# Patient Record
Sex: Female | Born: 1970 | Race: White | Hispanic: No | Marital: Married | Smoking: Former smoker
Health system: Southern US, Community
[De-identification: ages and names within clinical notes are randomized; demographics above are authoritative.]

## PROBLEM LIST (undated history)

## (undated) DIAGNOSIS — L709 Acne, unspecified: Secondary | ICD-10-CM

## (undated) DIAGNOSIS — L409 Psoriasis, unspecified: Secondary | ICD-10-CM

## (undated) DIAGNOSIS — N809 Endometriosis, unspecified: Secondary | ICD-10-CM

## (undated) HISTORY — DX: Psoriasis, unspecified: L40.9

## (undated) HISTORY — DX: Endometriosis, unspecified: N80.9

## (undated) HISTORY — DX: Acne, unspecified: L70.9

---

## 1999-09-06 ENCOUNTER — Other Ambulatory Visit: Admission: RE | Admit: 1999-09-06 | Discharge: 1999-09-06 | Payer: Self-pay | Admitting: Gynecology

## 2000-08-08 ENCOUNTER — Other Ambulatory Visit: Admission: RE | Admit: 2000-08-08 | Discharge: 2000-08-08 | Payer: Self-pay | Admitting: Gynecology

## 2001-08-09 ENCOUNTER — Other Ambulatory Visit: Admission: RE | Admit: 2001-08-09 | Discharge: 2001-08-09 | Payer: Self-pay | Admitting: Gynecology

## 2001-11-27 ENCOUNTER — Encounter: Admission: RE | Admit: 2001-11-27 | Discharge: 2001-11-27 | Payer: Self-pay | Admitting: Gynecology

## 2001-11-27 ENCOUNTER — Encounter: Payer: Self-pay | Admitting: Gynecology

## 2001-12-19 ENCOUNTER — Ambulatory Visit (HOSPITAL_BASED_OUTPATIENT_CLINIC_OR_DEPARTMENT_OTHER): Admission: RE | Admit: 2001-12-19 | Discharge: 2001-12-19 | Payer: Self-pay | Admitting: Gynecology

## 2001-12-19 ENCOUNTER — Encounter (INDEPENDENT_AMBULATORY_CARE_PROVIDER_SITE_OTHER): Payer: Self-pay | Admitting: *Deleted

## 2002-03-04 ENCOUNTER — Ambulatory Visit (HOSPITAL_BASED_OUTPATIENT_CLINIC_OR_DEPARTMENT_OTHER): Admission: RE | Admit: 2002-03-04 | Discharge: 2002-03-04 | Payer: Self-pay | Admitting: Gynecology

## 2002-10-09 ENCOUNTER — Other Ambulatory Visit: Admission: RE | Admit: 2002-10-09 | Discharge: 2002-10-09 | Payer: Self-pay | Admitting: Gynecology

## 2002-11-13 ENCOUNTER — Inpatient Hospital Stay (HOSPITAL_COMMUNITY): Admission: RE | Admit: 2002-11-13 | Discharge: 2002-11-14 | Payer: Self-pay | Admitting: Gynecology

## 2002-11-13 ENCOUNTER — Encounter (INDEPENDENT_AMBULATORY_CARE_PROVIDER_SITE_OTHER): Payer: Self-pay | Admitting: Specialist

## 2003-04-29 ENCOUNTER — Other Ambulatory Visit: Admission: RE | Admit: 2003-04-29 | Discharge: 2003-04-29 | Payer: Self-pay | Admitting: Gynecology

## 2004-03-18 ENCOUNTER — Inpatient Hospital Stay (HOSPITAL_COMMUNITY): Admission: RE | Admit: 2004-03-18 | Discharge: 2004-03-22 | Payer: Self-pay | Admitting: Obstetrics and Gynecology

## 2004-03-23 ENCOUNTER — Encounter: Admission: RE | Admit: 2004-03-23 | Discharge: 2004-04-22 | Payer: Self-pay | Admitting: Obstetrics and Gynecology

## 2004-04-23 ENCOUNTER — Encounter: Admission: RE | Admit: 2004-04-23 | Discharge: 2004-05-23 | Payer: Self-pay | Admitting: Obstetrics and Gynecology

## 2004-05-24 ENCOUNTER — Encounter: Admission: RE | Admit: 2004-05-24 | Discharge: 2004-06-23 | Payer: Self-pay | Admitting: Obstetrics and Gynecology

## 2004-05-31 ENCOUNTER — Other Ambulatory Visit: Admission: RE | Admit: 2004-05-31 | Discharge: 2004-05-31 | Payer: Self-pay | Admitting: Obstetrics and Gynecology

## 2004-07-24 ENCOUNTER — Encounter: Admission: RE | Admit: 2004-07-24 | Discharge: 2004-08-23 | Payer: Self-pay | Admitting: Obstetrics and Gynecology

## 2005-10-11 ENCOUNTER — Other Ambulatory Visit: Admission: RE | Admit: 2005-10-11 | Discharge: 2005-10-11 | Payer: Self-pay | Admitting: Obstetrics and Gynecology

## 2007-07-31 ENCOUNTER — Ambulatory Visit (HOSPITAL_COMMUNITY): Admission: RE | Admit: 2007-07-31 | Discharge: 2007-07-31 | Payer: Self-pay | Admitting: Physical Therapy

## 2011-02-03 NOTE — Op Note (Signed)
NAME:  Annette Rivera, Annette Rivera                         ACCOUNT NO.:  [id number]   MEDICAL RECORD NO.:  [id number]                   PATIENT TYPE:  INP   LOCATION:  0001                                 FACILITY:  [facility]   PHYSICIAN:  Howard C. Mezer, M.D.               DATE OF BIRTH:  [birth year]   DATE OF PROCEDURE:  11/13/2002  DATE OF DISCHARGE:                                 OPERATIVE REPORT   PREOPERATIVE DIAGNOSES:  Fibroid uterus.   POSTOPERATIVE DIAGNOSES:  Fibroid uterus.   OPERATION:  Abdominal myomectomy.   SURGEON:  Leatha Gilding. Mezer, M.D.   ASSISTANT:  Almedia Balls. Randell Patient, M.D.   ANESTHESIA:  General endotracheal.   PREPARATION:  Betadine.   DESCRIPTION OF PROCEDURE:  With the patient in the supine position prepped  and draped in routine fashion, a Pfannenstiel incision was made through the  skin and subcutaneous tissue. The fascia and peritoneum were opened without  difficulty. Brief exploration of her upper abdomen was benign. Exploration  of the pelvis revealed the uterus to be normal in size with a subserosal  fibroid extending from the right cornual area just below the fallopian tube  with the fibroid being somewhat larger than the uterus fundus. There was one  additional posterior intramural myoma that was left in situ. The fallopian  tubes were normal throughout their length with adequate fimbria bilaterally.  The left ovary was adherent to the pelvic sidewall with one dense band of  adhesions that was not lysed because the potential for causing additional  scar tissue and the ovary was quite mobile. The right ovary was normal.  There was no obvious endometriosis. The base of the subserosal fibroid was  circumscribed with cautery and the fibroid dissected free. The broad base  pedicle was then closed in layers with 3-0 PDS. The serosa was closed with a  baseball type stitch of 3-0 PDS. Hemostasis was completely intact. The  pelvis was irrigated with copious amounts of  irrigating solution and with  hemostasis intact an effort was made to place the large bowel in the cul-de-  sac, the omentum was brought down and the abdomen was closed in layers using  a running 2-0 Vicryl on the peritoneum, running #0 Vicryl at the midline  bilaterally on the fascia. Hemostasis was ensured in the subcutaneous tissue  and the skin was closed with staples. The estimated blood loss was less than  100 mL. The sponge, needle and instrument counts were correct x2. The  patient tolerated the procedure well and was taken to the recovery room in  satisfactory condition.                                               Leatha Gilding. Mezer, M.D.    HCM/MEDQ  D:  11/13/2002  T:  11/13/2002  Job:  [id number]

## 2011-02-03 NOTE — H&P (Signed)
NAME:  Annette Rivera, Annette Rivera                         ACCOUNT NO.:  [id number]   MEDICAL RECORD NO.:  [id number]                   PATIENT TYPE:  INP   LOCATION:  0001                                 FACILITY:  [facility]   PHYSICIAN:  Howard C. Mezer, M.D.               DATE OF BIRTH:  Jun 08, 1971   DATE OF ADMISSION:  11/13/2002  DATE OF DISCHARGE:                                HISTORY & PHYSICAL   ADMISSION DIAGNOSIS:  Fibroid uterus.   HISTORY OF PRESENT ILLNESS:  The patient is a 40 year old nulligravid female  admitted with last menstrual period on 11/08/02, with a large subserosal  fibroid for abdominal myomectomy.  The patient had undergone a laparoscopy  by another physician, at which time a small amount of endometriosis was  noted, and a subserosal fibroid was documented.  The photodocumentation was  reviewed, and it was noted that the subserosal fibroid was as large as if  not larger then the uterine fundus, and was on a pedicle that was at an  apparent significant risk for possible tortion if the patient were to become  pregnant.  This was discussed with the patient, and the pros and cons of  removing the fibroid at this time were reviewed in detail.  The potential  for postoperative adhesions and decreased fertility related to the surgery  had been reviewed in detail.  The patient has decided to proceed with  myomectomy.  Abdominal myomectomy has been discussed with the patient in  detail with the discussion of the potential complications, including, but  not limited to anesthesia, injury to the bowel, bladder, ureters, possible  blood loss with transfusion and sequela, and possible blood loss during or  after the surgery.  The postoperative expectations and restrictions have  been reviewed, and pain control has been reviewed.   PAST SURGICAL HISTORY:  1. Hysteroscopy.  2. Laparoscopy.   PAST MEDICAL HISTORY:  Noncontributory.   MEDICATIONS:  Prenatal vitamins.   ALLERGIES:   No known drug allergies.   FAMILY HISTORY:  Positive for lung cancer.   SOCIAL HISTORY:  The patient is a Therapist, music and married.   PHYSICAL EXAMINATION:  HEENT:  Negative.  CHEST:  Clear.  HEART:  Without murmurs.  BREASTS:  Without masses or discharge.  ABDOMEN:  Soft and nontender.  PELVIC:  The vagina and cervix to be normal.  The uterus is anterior, normal  in size, with a large fibroid in the cul-de-sac that is very mobile.  The  adnexa is without masses.  EXTREMITIES:  Negative.    IMPRESSION:  Subserosal fibroid.   PLAN:  Exploratory laparotomy and myomectomy.                                                Dimas Aguas  Annette Rivera, M.D.    HCM/MEDQ  D:  11/13/2002  T:  11/13/2002  Job:  [id number]

## 2011-02-03 NOTE — Op Note (Signed)
[facility]  Patient:    Annette Rivera, Annette Rivera Visit Number: [id number] MRN: [id number]          Service Type: NES Location: NESC Attending Physician:  Katrina Stack Dictated by:   Gretta Cool, M.D. Proc. Date: 12/19/01 Admit Date:  12/19/2001                             Operative Report  PREOPERATIVE DIAGNOSIS:  Endometrial polyp with abnormal bleeding.  POSTOPERATIVE DIAGNOSIS:  Endometrial polyp with abnormal bleeding.  PROCEDURE:  Hysteroscopy, resection of endometrial polyp.  ANESTHESIA:  IV sedation, paracervical block.  DESCRIPTION OF PROCEDURE:  Under excellent IV sedation, the patients cervix was progressively dilated with a series of Pratt dilator to accommodate a 7 mm resectoscope. The resectoscope was then introduced and the cavity photographed. The polyp was identified and then resected from the left lateral uterine wall and submitted for pathologic exam. The cavity was also examined and there was no evidence of abnormality of the cavity otherwise. At this point, the procedure was terminated without complications. No significant fluid deficit. The patient returned to the recovery room in excellent condition. Dictated by:   Gretta Cool, M.D. Attending Physician:  Katrina Stack DD:  12/19/01 TD:  12/20/01 Job: [id number]

## 2011-02-03 NOTE — Discharge Summary (Signed)
NAME:  Annette Rivera, Annette Rivera                         ACCOUNT NO.:  [id number]   MEDICAL RECORD NO.:  [id number]                   PATIENT TYPE:  INP   LOCATION:  9142                                 FACILITY:  [facility]   PHYSICIAN:  Juluis Mire, M.D.                DATE OF BIRTH:  08-Nov-1970   DATE OF ADMISSION:  03/18/2004  DATE OF DISCHARGE:  03/22/2004                                 DISCHARGE SUMMARY   ADMISSION DIAGNOSES:  1. Intrauterine pregnancy at 39-1/2 weeks' estimated gestational age.  2. Breech presentation.   DIAGNOSES:  1. Status post low transverse cesarean section.  2. Viable female infant.   PROCEDURE:  Primary low transverse cesarean section.   REASON FOR ADMISSION:  Please see dictated H&P.   HOSPITAL COURSE:  The patient was a 40 year old primigravida that was  admitted to [facility] at 39-1/2 weeks' estimated  gestational age with a breech presentation.  Pregnancy had been complicated  only by advanced maternal age and a history of a previous myomectomy.  The  patient had desired a primary low transverse cesarean section, and she  arrived at the hospital with a scheduled cesarean section.  On the morning  of admission the patient was taken to the operating room, where spinal  anesthesia was administered without difficulty.  A low transverse incision  was made with the delivery of a viable female infant weighing 7 pounds 7  ounces, with Apgars of 8 at one minute and 9 at five minutes.  The patient  tolerated the procedure well and was taken to the recovery room in stable  condition.  On postoperative day 2 the patient was without complaints.  She  had good relief with pain medication.  Abdomen was soft, fundus was firm and  nontender.  She had good return of bowel function.  Abdominal dressing was  noted to be clean, dry, and intact.  Labs revealed hemoglobin 8.3, platelet  count of 222,000, WBC count of 11.8.  On postoperative day 2 the patient was  without complaints.  She was tolerating a regular diet without complaints of  nausea and vomiting.  Abdomen was soft, fundus was firm and nontender.  She  was ambulating well.  On postoperative day 3 the patient was without  complaint.  Vital signs remained stable.  She was afebrile.  Incision was  clean, dry, and intact.  The patient did desire discharge in the a.m.  On  postoperative day 4 the patient was without complaint, fundus was firm and  nontender, incision was clean, dry, and intact.  The __________, and the  patient was discharged home.   CONDITION ON DISCHARGE:  Good.   DIET:  Regular as tolerated.   ACTIVITY:  No heavy lifting, no driving x2 weeks, no vaginal entry.   FOLLOW-UP:  Patient to follow up in the office in one to two weeks for an  incision check.  She is to call for a temperature greater than 100 degrees,  persistent nausea and vomiting, heavy vaginal bleeding, and/or redness or  drainage from the incisional site.   DISCHARGE MEDICATIONS:  1. Tylox #30, one p.o. q.4-6h. p.r.n. pain.  2. Motrin 600 mg every six hours.  3. Prenatal vitamins one p.o. daily.  4. Colace one p.o. daily p.r.n.     Julio Sicks, N.P.                        Juluis Mire, M.D.    CC/MEDQ  D:  04/22/2004  T:  04/25/2004  Job:  [id number]

## 2011-02-03 NOTE — H&P (Signed)
NAME:  Annette Rivera, Annette Rivera                         ACCOUNT NO.:  [id number]   MEDICAL RECORD NO.:  [id number]                   PATIENT TYPE:  INP   LOCATION:  NA                                   FACILITY:  [facility]   PHYSICIAN:  Dineen Kid. Rana Snare, M.D.                 DATE OF BIRTH:  05-28-1971   DATE OF ADMISSION:  03/18/2004  DATE OF DISCHARGE:                                HISTORY & PHYSICAL   HISTORY OF PRESENT ILLNESS:  Ms. Kozloski is a 40 year old, G1, at 39-1/[redacted]  weeks gestational age who presents for primary Cesarean section. Her  pregnancy has been complicated by advanced maternal age where she declined  amniocentesis. She had a normal ultrasound including a level 2 ultrasound.  She has a history of myomectomy which was superficial and okayed to deliver  vaginally. However, the baby is now breech and she desires primary Cesarean  section for breech. Her EDC is March 21, 2004, and her pregnancy otherwise was  uncomplicated.   PAST MEDICAL HISTORY:  Significant for infertility.   PAST SURGICAL HISTORY:  Laparoscopic removal of polyp and fibroid and in  vitro fertilization in October 2004.   MEDICATIONS:  Prenatal vitamins.   ALLERGIES:  No known drug allergies.   PHYSICAL EXAMINATION:  VITAL SIGNS: Her blood pressure is 104/52.  HEART: Regular rate and rhythm.  LUNGS: Clear to auscultation bilaterally.  ABDOMEN: Gravid and nontender. Fundal height is 38 cm. Cervix is closed,  thick, and high. Breech presentation.   IMPRESSION AND PLAN:  Intrauterine pregnancy at 39-1/2 weeks, breech  presentation. Plan is for primary low segment transverse Cesarean section.  The risks and benefits of the procedure were discussed at length which  include but are not limited to risks of infection, bleeding, damage to  uterus, tubes, ovaries, bowel, bladder, or fetus. The patient gives her  informed consent and wishes to proceed.                                               Dineen Kid Rana Snare, M.D.    DCL/MEDQ  D:  03/17/2004  T:  03/17/2004  Job:  [id number]

## 2011-02-03 NOTE — Op Note (Signed)
[facility]  Patient:    Annette Rivera, Annette Rivera Visit Number: [id number] MRN: [id number]          Service Type: NES Location: NESC Attending Physician:  Katrina Stack Dictated by:   Gretta Cool, M.D. Proc. Date: 03/04/02 Admit Date:  03/04/2002                             Operative Report  PREOPERATIVE DIAGNOSIS:  Cyclic pelvic pain, suspect endometriosis.  POSTOPERATIVE DIAGNOSES: 1. Endometriosis, stage I. 2. Subserosal uterine leiomyomata.  SURGEON:  Gretta Cool, M.D.  ANESTHESIA:  General orotracheal.  DESCRIPTION OF PROCEDURE:  Under excellent general anesthesia with the patients abdomen prepped and draped as a sterile field, in Fontenelle stirrups with the bladder drained and the Hulka tenaculum applied to the cervix, a subumbilical incision was made.  The Veress cannula was then introduced and pneumoperitoneum obtained with carbon dioxide approximately 2 L.  Laparoscope trocar was then introduced and pelvic organs examined.  The uterus was deformed by a right fundal leiomyoma posterior to the right fallopian tube, protruding from the fundal wall of the uterus.  The fibroid was pedunculated and approximately the same size or slightly larger than the uterus.  There was another fundal fibroid bulging from the posterior wall as well.  The fallopian tubes and fimbriae appeared healthy and normal.  Both ovaries appeared normal. There was a follicular cyst on the right.  There appeared to have been regular ovulation from both ovaries.  Examination of her pelvis revealed a pool of straw-colored fluid in the pelvis, approximately 20-30 cc.  Above the pool there was evidence of endometriosis with smooth, glistening implants on the posterior aspect of the uterus, the posterior leaf of the broad ligament, uterosacral area on both sides, left greater than right.  At this point attention was turned to laser ablation.  A second accessory probe was  placed under direct vision and the CO2 laser used to vaporize the large implants of endometriosis directly.  Smaller implants were then treated by a brush technique because of the number of them.  The entire cul-de-sac and the right posterior leaf of the broad ligament was treated by defocused laser brush technique so as to vaporize and thermally injure endometriosis beyond repair. At this point the pelvis was irrigated with lactated Ringers of debris.  No attempt was made to remove the pedunculated leiomyoma.  At this point the gas was allowed to escape and the incisions closed with deep suture of 5-0 Vicryl and skin closure with Steri-Strips.  At the end of the procedure, sponge and lap counts were correct.  No complications.  The patient returned to the recovery room in excellent condition. Dictated by:   Gretta Cool, M.D. Attending Physician:  Katrina Stack DD:  03/04/02 TD:  03/05/02 Job: [id number]

## 2011-02-03 NOTE — Op Note (Signed)
NAME:  Annette Rivera, Annette Rivera                         ACCOUNT NO.:  [id number]   MEDICAL RECORD NO.:  [id number]                   PATIENT TYPE:  INP   LOCATION:  9142                                 FACILITY:  [facility]   PHYSICIAN:  Dineen Kid. Rana Snare, M.D.                 DATE OF BIRTH:  1970-10-12   DATE OF PROCEDURE:  03/18/2004  DATE OF DISCHARGE:                                 OPERATIVE REPORT   PREOPERATIVE DIAGNOSES:  1. Intrauterine pregnancy at 39-1/2 weeks.  2. Breech presentation.   POSTOPERATIVE DIAGNOSES:  1. Intrauterine pregnancy at 39-1/2 weeks.  2. Breech presentation.   PROCEDURE:  Primary low segment transverse cesarean section.   SURGEON:  Dineen Kid. Rana Snare, M.D.   ANESTHESIA:  Spinal.   INDICATIONS:  [name] is a 40 year old G1 at 39-1/2 weeks with breech  presentation.  Plan primary low segment transverse cesarean section.  Her  pregnancy was complicated only by advanced maternal age and a history of  previous myomectomy.  She desires primary low segment transverse cesarean  section.  Risks and benefits were discussed.  Informed consent was obtained.   FINDINGS AT THE TIME OF SURGERY:  A viable female infant, Apgars were 8 and 9,  pH was not done.  Weight 7 pounds 7 ounces.   DESCRIPTION OF PROCEDURE:  After adequate analgesia, the patient was placed  in the supine position with left lateral tilt, sterilely prepped and draped,  the bladder was sterilely drained with a Foley catheter.  A Pfannenstiel  skin incision was made two fingerbreadths above the pubic symphysis, taken  down sharply to fascia, which was incised transversely, extended superiorly  and inferiorly off the bellies of the rectus muscle, which was separated  sharply in the midline.  The peritoneum was entered sharply, the bladder  flap was created and placed behind the bladder blade.  A low segment myotomy  incision was made down to the amniotic sac.  Amniotomy was performed with  clear fluid.  The  infant's buttocks was initially delivered into the  incision, the legs reduced, the arms were easily reduced, and the head was  delivered atraumatically.  The nares and pharynx were then suctioned, cord  clamped and cut, handed to the pediatricians for resuscitation.  Cord blood  was obtained.  The placenta extracted manually.  The uterus was  exteriorized, wiped clean with a dry lap.  The myotomy incision was closed  in two layers, the first being  a running locking layer, the second being an  imbricating layer of 0 Monocryl suture.  The uterus was placed back into the  peritoneal cavity and after copious amount of irrigation, adequate  hemostasis was assured.  The peritoneum was closed with 0 Monocryl, the  rectus muscle plicated in the midline with 0 Monocryl suture.  The fascia  was then closed with a #1 Vicryl in a running fashion.  Irrigation was  applied and after adequate hemostasis, the skin was  stapled and Steri-Strips applied.  The patient tolerated the procedure well  and was stable on transfer to the recovery room.  The sponge, needle, and  instrument count was normal x3.  The estimated blood loss during the  procedure was 800 mL.  The patient received 1 g of Rocephin after delivery  of the placenta.                                               Dineen Kid Rana Snare, M.D.    DCL/MEDQ  D:  03/18/2004  T:  03/21/2004  Job:  [id number]

## 2011-08-21 ENCOUNTER — Other Ambulatory Visit: Payer: Self-pay | Admitting: Family Medicine

## 2011-08-21 DIAGNOSIS — N6459 Other signs and symptoms in breast: Secondary | ICD-10-CM

## 2011-08-25 ENCOUNTER — Other Ambulatory Visit: Payer: Self-pay | Admitting: Family Medicine

## 2011-08-25 ENCOUNTER — Ambulatory Visit
Admission: RE | Admit: 2011-08-25 | Discharge: 2011-08-25 | Disposition: A | Discharge status: Home / Self Care | Payer: BC Managed Care – PPO | Source: Ambulatory Visit | Attending: Family Medicine | Admitting: Family Medicine

## 2011-08-25 DIAGNOSIS — N6459 Other signs and symptoms in breast: Secondary | ICD-10-CM

## 2012-09-30 ENCOUNTER — Other Ambulatory Visit: Payer: Self-pay | Admitting: Family Medicine

## 2012-09-30 ENCOUNTER — Ambulatory Visit
Admission: RE | Admit: 2012-09-30 | Discharge: 2012-09-30 | Disposition: A | Discharge status: Home / Self Care | Payer: BC Managed Care – PPO | Source: Ambulatory Visit | Attending: Family Medicine | Admitting: Family Medicine

## 2012-09-30 DIAGNOSIS — M79644 Pain in right finger(s): Secondary | ICD-10-CM

## 2012-09-30 DIAGNOSIS — W19XXXA Unspecified fall, initial encounter: Secondary | ICD-10-CM

## 2014-04-30 IMAGING — CR DG HAND COMPLETE 3+V*R*
4 series · 4 of 4 positions shown · non-contrast
Comparison: None.

CLINICAL DATA: Fell 1 month ago with pain and swelling particularly
at the first MCP joint

RIGHT HAND - COMPLETE 3+ VIEW

[view not recorded (1 of 4)]
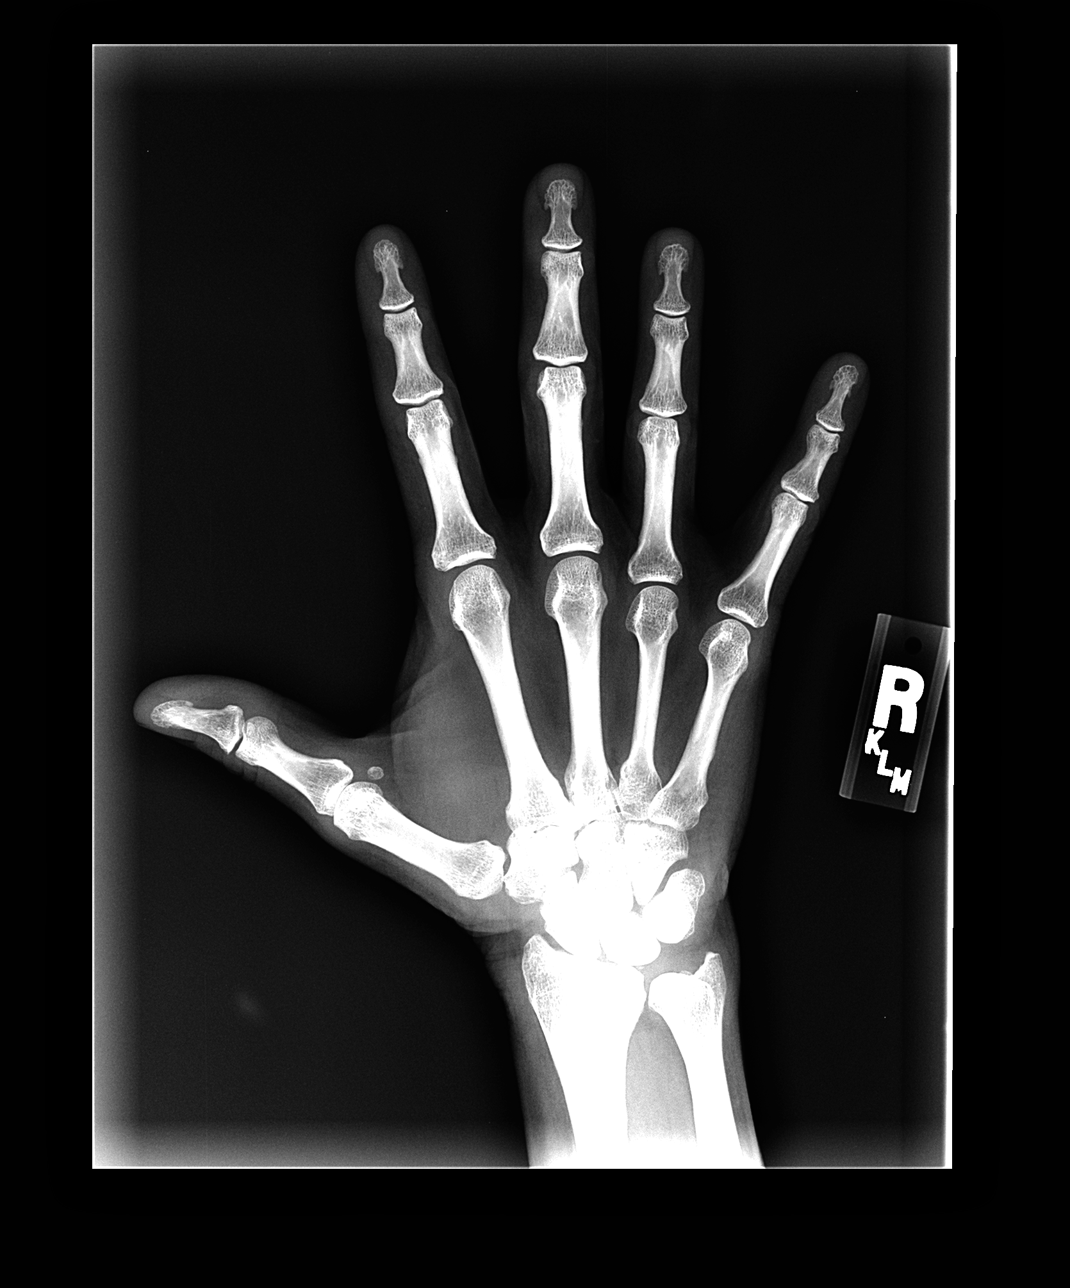

[view not recorded (2 of 4)]
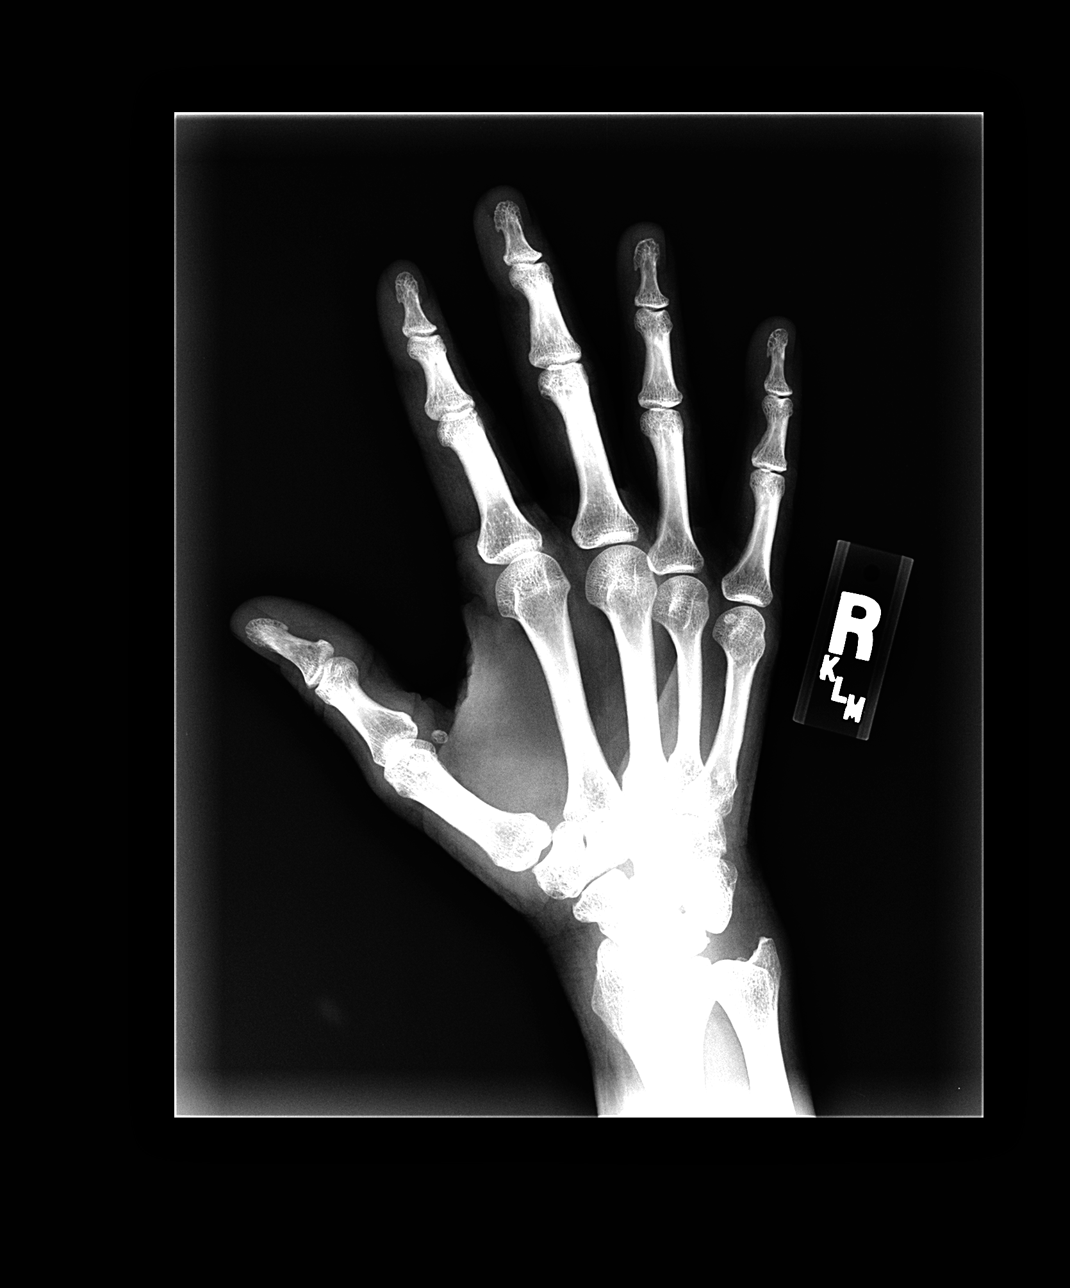

[view not recorded (3 of 4)]
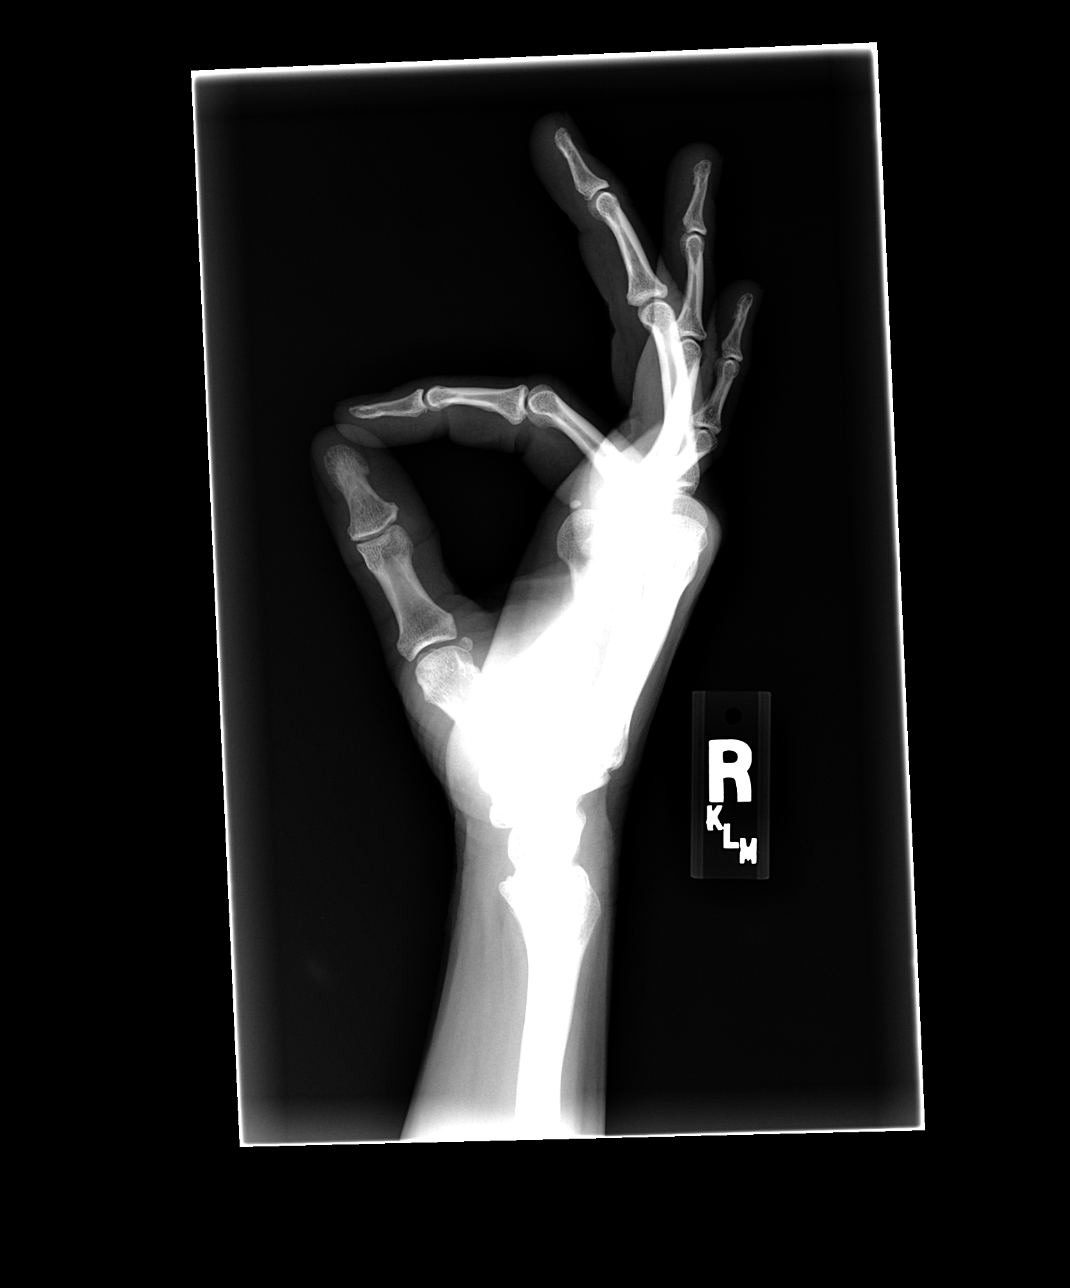

[view not recorded (4 of 4)]
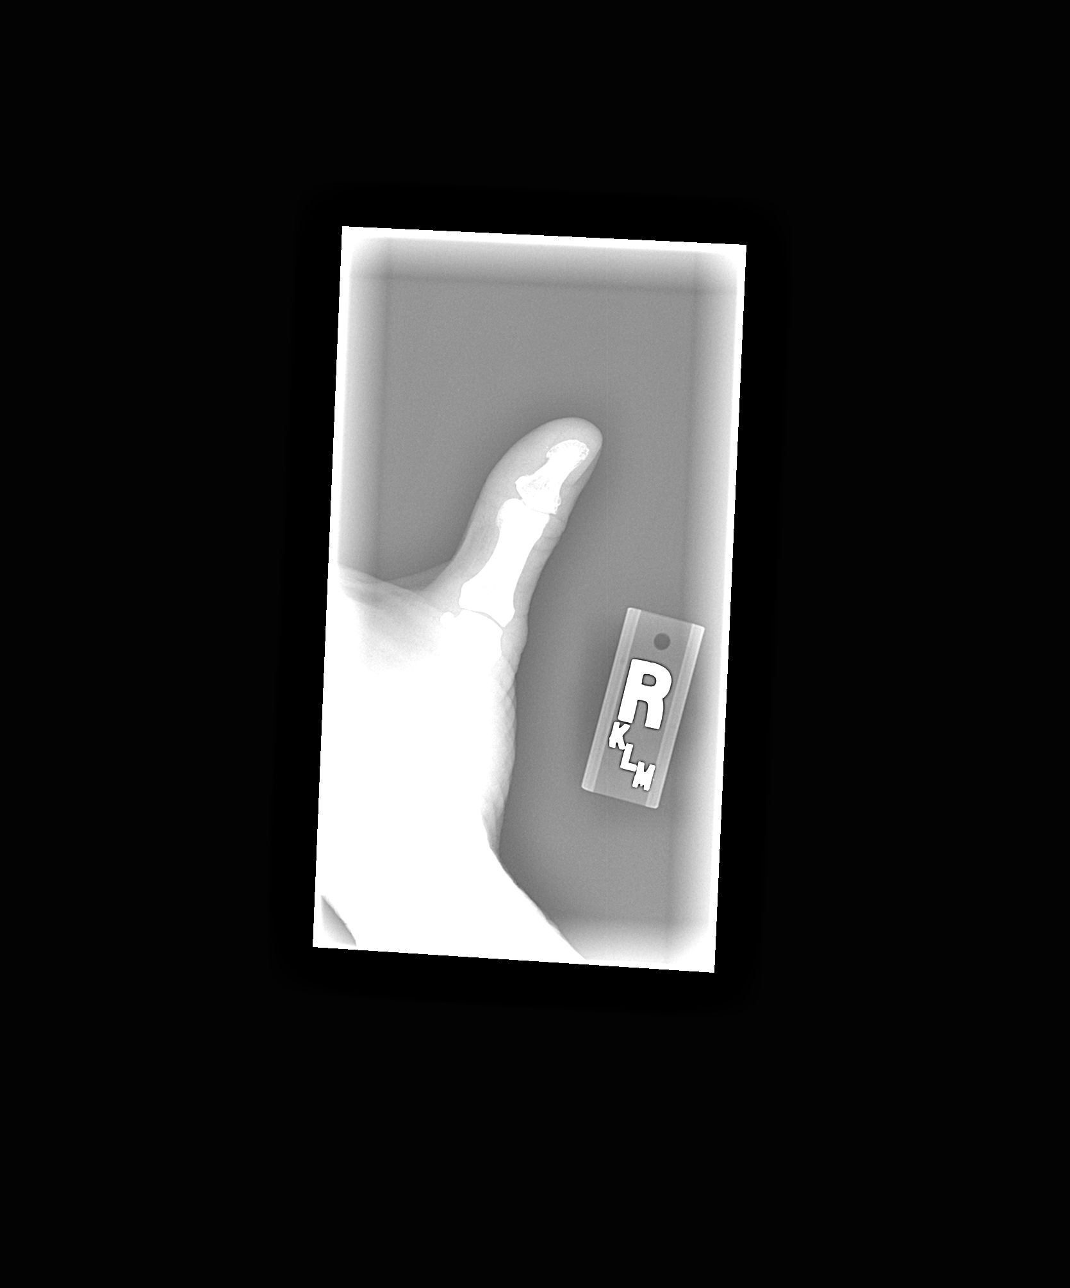

[4 of 4 positions shown; findings below may reference images not displayed]

FINDINGS: No acute fracture is seen.  Alignment is normal.  Joint
spaces appear normal.  The radiocarpal joint space and carpal bones
are unremarkable.
IMPRESSION: Negative.

## 2014-05-18 ENCOUNTER — Other Ambulatory Visit: Payer: Self-pay | Admitting: Obstetrics and Gynecology

## 2014-12-23 ENCOUNTER — Other Ambulatory Visit: Payer: Self-pay | Admitting: Family Medicine

## 2014-12-23 ENCOUNTER — Ambulatory Visit
Admission: RE | Admit: 2014-12-23 | Discharge: 2014-12-23 | Disposition: A | Discharge status: Home / Self Care | Payer: BLUE CROSS/BLUE SHIELD | Source: Ambulatory Visit | Attending: Family Medicine | Admitting: Family Medicine

## 2014-12-23 DIAGNOSIS — R05 Cough: Secondary | ICD-10-CM

## 2014-12-23 DIAGNOSIS — R053 Chronic cough: Secondary | ICD-10-CM

## 2015-01-19 ENCOUNTER — Institutional Professional Consult (permissible substitution): Payer: BLUE CROSS/BLUE SHIELD | Admitting: Internal Medicine

## 2015-02-08 ENCOUNTER — Ambulatory Visit (INDEPENDENT_AMBULATORY_CARE_PROVIDER_SITE_OTHER): Payer: BLUE CROSS/BLUE SHIELD | Admitting: Internal Medicine

## 2015-02-08 ENCOUNTER — Encounter: Payer: Self-pay | Admitting: *Deleted

## 2015-02-08 VITALS — BP 110/68 | HR 75 | Ht 63.0 in | Wt 140.0 lb

## 2015-02-08 DIAGNOSIS — R05 Cough: Secondary | ICD-10-CM

## 2015-02-08 DIAGNOSIS — R059 Cough, unspecified: Secondary | ICD-10-CM | POA: Insufficient documentation

## 2015-02-08 NOTE — Progress Notes (Signed)
Subjective:    Patient ID: Annette Rivera, female    DOB: 09/25/1970  MRN: [id number]  HPI  44 yowf minimal smoking with new onset cough abruptly in setting of ?/ viral uri with sorethroat / body aches late March 2016 which resolved but cough did not so referred 02/08/2015 to pulmonar clinic by Cammie Fulp with abn cxr.    02/08/2015 1st [facility] office visit/ Glena Pharris   Chief Complaint  Patient presents with  . Pulmonary Consult    Referred by Cain Saupeammie Fulp, MD for eval of cough and abnormal cxr. Pt c/o cough since March 2016.  Cough is prod with minimal clear sputum and seems worse in the am.   cough worst p stirs in am with minimal clear sputum and rest of the day feels urge to clear throat and no better p several rounds of abx and prednisone worked the best with about 95% improvement.  Previously was coughing so hard she would gag/vomit but not now. Working out regularly s sob or cough  Mostly concerned if she has copd as suggested on cxr.   No obvious day to day or daytime variabilty or assoc    cp or chest tightness, subjective wheeze overt sinus or hb symptoms. No unusual exp hx or h/o childhood pna/ asthma or knowledge of premature birth.  Sleeping ok without nocturnal  or early am exacerbation  of respiratory  c/o's or need for noct saba. Also denies any obvious fluctuation of symptoms with weather or environmental changes or other aggravating or alleviating factors except as outlined above   Current Medications, Allergies, Complete Past Medical History, Past Surgical History, Family History, and Social History were reviewed in [facility] electronic medical record.           Review of Systems  Constitutional: Negative for fever, chills and unexpected weight change.  HENT: Negative for congestion, dental problem, ear pain, nosebleeds, postnasal drip, rhinorrhea, sinus pressure, sneezing, sore throat, trouble swallowing and voice change.   Eyes: Negative for visual  disturbance.  Respiratory: Positive for cough. Negative for choking and shortness of breath.   Cardiovascular: Negative for chest pain and leg swelling.  Gastrointestinal: Negative for vomiting, abdominal pain and diarrhea.  Genitourinary: Negative for difficulty urinating.  Musculoskeletal: Negative for arthralgias.  Skin: Negative for rash.  Neurological: Negative for tremors, syncope and headaches.  Hematological: Does not bruise/bleed easily.       Objective:   Physical Exam   amb pleasant wf nad/ did not cough during interview or exam   Vital signs reviewed    HEENT: nl dentition, turbinates, and orophanx. Nl external ear canals without cough reflex   NECK :  without JVD/Nodes/TM/ nl carotid upstrokes bilaterally   LUNGS: no acc muscle use, clear to A and P bilaterally without cough on insp or exp maneuvers   CV:  RRR  no s3 or murmur or increase in P2, no edema   ABD:  soft and nontender with nl excursion in the supine position. No bruits or organomegaly, bowel sounds nl  MS:  warm without deformities, calf tenderness, cyanosis or clubbing  SKIN: warm and dry without lesions    NEURO:  alert, approp, no deficits    I personally reviewed images and agree with radiology impression as follows:  CXR:  12/23/14   Hyperinflation may reflect reactive airway disease or COPD. There is no pneumonia nor other active cardiopulmonary disease.  Assessment & Plan:

## 2015-02-08 NOTE — Assessment & Plan Note (Signed)
-   spirometry 02/08/2015 wnl including mid flows  No evidence at all for copd here. Of the three most common causes of chronic cough, only one (GERD)  can actually cause the other two (asthma and post nasal drip syndrome)  and perpetuate the cylce of cough inducing airway trauma, inflammation, heightened sensitivity to reflux which is prompted by the cough itself via a cyclical mechanism.    This may partially respond to steroids (as was the case here)  and look like asthma and post nasal drainage but never erradicated completely unless the cough and the secondary reflux are eliminated, preferably both at the same time.  While not intuitively obvious, many patients with chronic low grade reflux do not cough until there is a secondary insult that disturbs the protective epithelial barrier and exposes sensitive nerve endings.  This can be viral (as was probably the case here)  or direct physical injury such as with an endotracheal tube.   The point is that once this occurs, it is difficult to eliminate using anything but a maximally effective acid suppression regimen at least in the short run, accompanied by an appropriate diet to address non acid GERD.   She is 95% better now but cough has  lingered for almost 2 months so rec consider adding gerd rx/ diet now but certainly with next flare then f/u here prn if not responding to conservative rx.

## 2015-02-08 NOTE — Patient Instructions (Addendum)
At next onset of cough take delsym up to 2 tsp every 12 hours and  Try prilosec 20mg   Take 30-60 min before first meal of the day and Pepcid (famotidine) 20 mg one bedtime until cough is completely gone for at least a week without the need for cough suppression and the cough should be much less severe.   GERD (REFLUX)  is an extremely common cause of respiratory symptoms just like yours , many times with no obvious heartburn at all.    It can be treated with medication, but also with lifestyle changes including elevating the head of your bed, avoidance of late meals, excessive alcohol, smoking cessation, and avoid fatty foods, chocolate, peppermint, colas, red wine, and acidic juices such as orange juice.  NO MINT OR MENTHOL PRODUCTS SO NO COUGH DROPS  USE SUGARLESS CANDY INSTEAD (Jolley ranchers or Stover's or Life Savers) or even ice chips will also do - the key is to swallow to prevent all throat clearing. NO OIL BASED VITAMINS - use powdered substitutes.  You do not have any evidence at all of copd, so pulmonary follow up is as needed

## 2015-12-18 DIAGNOSIS — S83511A Sprain of anterior cruciate ligament of right knee, initial encounter: Secondary | ICD-10-CM | POA: Diagnosis not present

## 2015-12-22 DIAGNOSIS — M6281 Muscle weakness (generalized): Secondary | ICD-10-CM | POA: Diagnosis not present

## 2015-12-22 DIAGNOSIS — R262 Difficulty in walking, not elsewhere classified: Secondary | ICD-10-CM | POA: Diagnosis not present

## 2015-12-22 DIAGNOSIS — M25461 Effusion, right knee: Secondary | ICD-10-CM | POA: Diagnosis not present

## 2015-12-22 DIAGNOSIS — M25661 Stiffness of right knee, not elsewhere classified: Secondary | ICD-10-CM | POA: Diagnosis not present

## 2015-12-24 DIAGNOSIS — M6281 Muscle weakness (generalized): Secondary | ICD-10-CM | POA: Diagnosis not present

## 2015-12-24 DIAGNOSIS — R262 Difficulty in walking, not elsewhere classified: Secondary | ICD-10-CM | POA: Diagnosis not present

## 2015-12-24 DIAGNOSIS — M25661 Stiffness of right knee, not elsewhere classified: Secondary | ICD-10-CM | POA: Diagnosis not present

## 2015-12-24 DIAGNOSIS — M25461 Effusion, right knee: Secondary | ICD-10-CM | POA: Diagnosis not present

## 2015-12-27 DIAGNOSIS — M25461 Effusion, right knee: Secondary | ICD-10-CM | POA: Diagnosis not present

## 2015-12-27 DIAGNOSIS — M6281 Muscle weakness (generalized): Secondary | ICD-10-CM | POA: Diagnosis not present

## 2015-12-27 DIAGNOSIS — M25661 Stiffness of right knee, not elsewhere classified: Secondary | ICD-10-CM | POA: Diagnosis not present

## 2015-12-27 DIAGNOSIS — R262 Difficulty in walking, not elsewhere classified: Secondary | ICD-10-CM | POA: Diagnosis not present

## 2015-12-29 DIAGNOSIS — M6281 Muscle weakness (generalized): Secondary | ICD-10-CM | POA: Diagnosis not present

## 2015-12-29 DIAGNOSIS — M25661 Stiffness of right knee, not elsewhere classified: Secondary | ICD-10-CM | POA: Diagnosis not present

## 2015-12-29 DIAGNOSIS — M25461 Effusion, right knee: Secondary | ICD-10-CM | POA: Diagnosis not present

## 2015-12-29 DIAGNOSIS — R262 Difficulty in walking, not elsewhere classified: Secondary | ICD-10-CM | POA: Diagnosis not present

## 2016-01-11 DIAGNOSIS — M25461 Effusion, right knee: Secondary | ICD-10-CM | POA: Diagnosis not present

## 2016-01-11 DIAGNOSIS — R262 Difficulty in walking, not elsewhere classified: Secondary | ICD-10-CM | POA: Diagnosis not present

## 2016-01-11 DIAGNOSIS — M25661 Stiffness of right knee, not elsewhere classified: Secondary | ICD-10-CM | POA: Diagnosis not present

## 2016-01-11 DIAGNOSIS — M6281 Muscle weakness (generalized): Secondary | ICD-10-CM | POA: Diagnosis not present

## 2016-01-13 DIAGNOSIS — R262 Difficulty in walking, not elsewhere classified: Secondary | ICD-10-CM | POA: Diagnosis not present

## 2016-01-13 DIAGNOSIS — M25661 Stiffness of right knee, not elsewhere classified: Secondary | ICD-10-CM | POA: Diagnosis not present

## 2016-01-13 DIAGNOSIS — M25461 Effusion, right knee: Secondary | ICD-10-CM | POA: Diagnosis not present

## 2016-01-13 DIAGNOSIS — M6281 Muscle weakness (generalized): Secondary | ICD-10-CM | POA: Diagnosis not present

## 2016-01-18 DIAGNOSIS — M6281 Muscle weakness (generalized): Secondary | ICD-10-CM | POA: Diagnosis not present

## 2016-01-18 DIAGNOSIS — M25461 Effusion, right knee: Secondary | ICD-10-CM | POA: Diagnosis not present

## 2016-01-18 DIAGNOSIS — R262 Difficulty in walking, not elsewhere classified: Secondary | ICD-10-CM | POA: Diagnosis not present

## 2016-01-18 DIAGNOSIS — M25661 Stiffness of right knee, not elsewhere classified: Secondary | ICD-10-CM | POA: Diagnosis not present

## 2016-01-20 DIAGNOSIS — M25461 Effusion, right knee: Secondary | ICD-10-CM | POA: Diagnosis not present

## 2016-01-20 DIAGNOSIS — R262 Difficulty in walking, not elsewhere classified: Secondary | ICD-10-CM | POA: Diagnosis not present

## 2016-01-20 DIAGNOSIS — M25661 Stiffness of right knee, not elsewhere classified: Secondary | ICD-10-CM | POA: Diagnosis not present

## 2016-01-20 DIAGNOSIS — M6281 Muscle weakness (generalized): Secondary | ICD-10-CM | POA: Diagnosis not present

## 2016-01-25 DIAGNOSIS — M6281 Muscle weakness (generalized): Secondary | ICD-10-CM | POA: Diagnosis not present

## 2016-01-25 DIAGNOSIS — R262 Difficulty in walking, not elsewhere classified: Secondary | ICD-10-CM | POA: Diagnosis not present

## 2016-01-25 DIAGNOSIS — M25661 Stiffness of right knee, not elsewhere classified: Secondary | ICD-10-CM | POA: Diagnosis not present

## 2016-01-25 DIAGNOSIS — M25461 Effusion, right knee: Secondary | ICD-10-CM | POA: Diagnosis not present

## 2016-01-27 DIAGNOSIS — R262 Difficulty in walking, not elsewhere classified: Secondary | ICD-10-CM | POA: Diagnosis not present

## 2016-01-27 DIAGNOSIS — M25461 Effusion, right knee: Secondary | ICD-10-CM | POA: Diagnosis not present

## 2016-01-27 DIAGNOSIS — M6281 Muscle weakness (generalized): Secondary | ICD-10-CM | POA: Diagnosis not present

## 2016-01-27 DIAGNOSIS — M25661 Stiffness of right knee, not elsewhere classified: Secondary | ICD-10-CM | POA: Diagnosis not present

## 2016-02-01 DIAGNOSIS — M25461 Effusion, right knee: Secondary | ICD-10-CM | POA: Diagnosis not present

## 2016-02-01 DIAGNOSIS — M6281 Muscle weakness (generalized): Secondary | ICD-10-CM | POA: Diagnosis not present

## 2016-02-01 DIAGNOSIS — M25661 Stiffness of right knee, not elsewhere classified: Secondary | ICD-10-CM | POA: Diagnosis not present

## 2016-02-01 DIAGNOSIS — R262 Difficulty in walking, not elsewhere classified: Secondary | ICD-10-CM | POA: Diagnosis not present

## 2016-02-03 DIAGNOSIS — R262 Difficulty in walking, not elsewhere classified: Secondary | ICD-10-CM | POA: Diagnosis not present

## 2016-02-03 DIAGNOSIS — M25661 Stiffness of right knee, not elsewhere classified: Secondary | ICD-10-CM | POA: Diagnosis not present

## 2016-02-03 DIAGNOSIS — M6281 Muscle weakness (generalized): Secondary | ICD-10-CM | POA: Diagnosis not present

## 2016-02-03 DIAGNOSIS — M25461 Effusion, right knee: Secondary | ICD-10-CM | POA: Diagnosis not present

## 2016-02-08 DIAGNOSIS — Z6824 Body mass index (BMI) 24.0-24.9, adult: Secondary | ICD-10-CM | POA: Diagnosis not present

## 2016-02-08 DIAGNOSIS — Z713 Dietary counseling and surveillance: Secondary | ICD-10-CM | POA: Diagnosis not present

## 2016-02-08 DIAGNOSIS — M6281 Muscle weakness (generalized): Secondary | ICD-10-CM | POA: Diagnosis not present

## 2016-02-08 DIAGNOSIS — M25661 Stiffness of right knee, not elsewhere classified: Secondary | ICD-10-CM | POA: Diagnosis not present

## 2016-02-08 DIAGNOSIS — M25461 Effusion, right knee: Secondary | ICD-10-CM | POA: Diagnosis not present

## 2016-02-08 DIAGNOSIS — R262 Difficulty in walking, not elsewhere classified: Secondary | ICD-10-CM | POA: Diagnosis not present

## 2016-02-16 DIAGNOSIS — R262 Difficulty in walking, not elsewhere classified: Secondary | ICD-10-CM | POA: Diagnosis not present

## 2016-02-16 DIAGNOSIS — M25661 Stiffness of right knee, not elsewhere classified: Secondary | ICD-10-CM | POA: Diagnosis not present

## 2016-02-16 DIAGNOSIS — M6281 Muscle weakness (generalized): Secondary | ICD-10-CM | POA: Diagnosis not present

## 2016-02-16 DIAGNOSIS — M25461 Effusion, right knee: Secondary | ICD-10-CM | POA: Diagnosis not present

## 2016-02-17 DIAGNOSIS — R262 Difficulty in walking, not elsewhere classified: Secondary | ICD-10-CM | POA: Diagnosis not present

## 2016-02-17 DIAGNOSIS — M6281 Muscle weakness (generalized): Secondary | ICD-10-CM | POA: Diagnosis not present

## 2016-02-17 DIAGNOSIS — M25461 Effusion, right knee: Secondary | ICD-10-CM | POA: Diagnosis not present

## 2016-02-17 DIAGNOSIS — M25661 Stiffness of right knee, not elsewhere classified: Secondary | ICD-10-CM | POA: Diagnosis not present

## 2016-02-22 DIAGNOSIS — M25661 Stiffness of right knee, not elsewhere classified: Secondary | ICD-10-CM | POA: Diagnosis not present

## 2016-02-22 DIAGNOSIS — M6281 Muscle weakness (generalized): Secondary | ICD-10-CM | POA: Diagnosis not present

## 2016-02-22 DIAGNOSIS — M25461 Effusion, right knee: Secondary | ICD-10-CM | POA: Diagnosis not present

## 2016-02-22 DIAGNOSIS — R262 Difficulty in walking, not elsewhere classified: Secondary | ICD-10-CM | POA: Diagnosis not present

## 2016-02-24 DIAGNOSIS — M25661 Stiffness of right knee, not elsewhere classified: Secondary | ICD-10-CM | POA: Diagnosis not present

## 2016-02-24 DIAGNOSIS — M25461 Effusion, right knee: Secondary | ICD-10-CM | POA: Diagnosis not present

## 2016-02-24 DIAGNOSIS — M6281 Muscle weakness (generalized): Secondary | ICD-10-CM | POA: Diagnosis not present

## 2016-02-24 DIAGNOSIS — R262 Difficulty in walking, not elsewhere classified: Secondary | ICD-10-CM | POA: Diagnosis not present

## 2016-02-29 DIAGNOSIS — M25661 Stiffness of right knee, not elsewhere classified: Secondary | ICD-10-CM | POA: Diagnosis not present

## 2016-02-29 DIAGNOSIS — M6281 Muscle weakness (generalized): Secondary | ICD-10-CM | POA: Diagnosis not present

## 2016-02-29 DIAGNOSIS — M25461 Effusion, right knee: Secondary | ICD-10-CM | POA: Diagnosis not present

## 2016-02-29 DIAGNOSIS — R262 Difficulty in walking, not elsewhere classified: Secondary | ICD-10-CM | POA: Diagnosis not present

## 2016-03-06 DIAGNOSIS — M25461 Effusion, right knee: Secondary | ICD-10-CM | POA: Diagnosis not present

## 2016-03-06 DIAGNOSIS — R262 Difficulty in walking, not elsewhere classified: Secondary | ICD-10-CM | POA: Diagnosis not present

## 2016-03-06 DIAGNOSIS — M25661 Stiffness of right knee, not elsewhere classified: Secondary | ICD-10-CM | POA: Diagnosis not present

## 2016-03-06 DIAGNOSIS — M6281 Muscle weakness (generalized): Secondary | ICD-10-CM | POA: Diagnosis not present

## 2016-03-07 DIAGNOSIS — M25461 Effusion, right knee: Secondary | ICD-10-CM | POA: Diagnosis not present

## 2016-03-13 DIAGNOSIS — Z1231 Encounter for screening mammogram for malignant neoplasm of breast: Secondary | ICD-10-CM | POA: Diagnosis not present

## 2016-03-14 DIAGNOSIS — R262 Difficulty in walking, not elsewhere classified: Secondary | ICD-10-CM | POA: Diagnosis not present

## 2016-03-14 DIAGNOSIS — M25661 Stiffness of right knee, not elsewhere classified: Secondary | ICD-10-CM | POA: Diagnosis not present

## 2016-03-14 DIAGNOSIS — M25461 Effusion, right knee: Secondary | ICD-10-CM | POA: Diagnosis not present

## 2016-03-14 DIAGNOSIS — M6281 Muscle weakness (generalized): Secondary | ICD-10-CM | POA: Diagnosis not present

## 2016-05-02 DIAGNOSIS — M25461 Effusion, right knee: Secondary | ICD-10-CM | POA: Diagnosis not present

## 2016-05-09 DIAGNOSIS — R262 Difficulty in walking, not elsewhere classified: Secondary | ICD-10-CM | POA: Diagnosis not present

## 2016-05-09 DIAGNOSIS — S83511D Sprain of anterior cruciate ligament of right knee, subsequent encounter: Secondary | ICD-10-CM | POA: Diagnosis not present

## 2016-05-16 DIAGNOSIS — S83511D Sprain of anterior cruciate ligament of right knee, subsequent encounter: Secondary | ICD-10-CM | POA: Diagnosis not present

## 2016-05-16 DIAGNOSIS — R262 Difficulty in walking, not elsewhere classified: Secondary | ICD-10-CM | POA: Diagnosis not present

## 2016-05-23 DIAGNOSIS — S83511D Sprain of anterior cruciate ligament of right knee, subsequent encounter: Secondary | ICD-10-CM | POA: Diagnosis not present

## 2016-05-23 DIAGNOSIS — R262 Difficulty in walking, not elsewhere classified: Secondary | ICD-10-CM | POA: Diagnosis not present

## 2016-05-29 DIAGNOSIS — Z6824 Body mass index (BMI) 24.0-24.9, adult: Secondary | ICD-10-CM | POA: Diagnosis not present

## 2016-05-29 DIAGNOSIS — Z713 Dietary counseling and surveillance: Secondary | ICD-10-CM | POA: Diagnosis not present

## 2016-05-30 DIAGNOSIS — S83511D Sprain of anterior cruciate ligament of right knee, subsequent encounter: Secondary | ICD-10-CM | POA: Diagnosis not present

## 2016-05-30 DIAGNOSIS — R262 Difficulty in walking, not elsewhere classified: Secondary | ICD-10-CM | POA: Diagnosis not present

## 2016-06-05 DIAGNOSIS — S83511D Sprain of anterior cruciate ligament of right knee, subsequent encounter: Secondary | ICD-10-CM | POA: Diagnosis not present

## 2016-06-05 DIAGNOSIS — R262 Difficulty in walking, not elsewhere classified: Secondary | ICD-10-CM | POA: Diagnosis not present

## 2016-06-06 DIAGNOSIS — J029 Acute pharyngitis, unspecified: Secondary | ICD-10-CM | POA: Diagnosis not present

## 2016-06-12 DIAGNOSIS — S83511D Sprain of anterior cruciate ligament of right knee, subsequent encounter: Secondary | ICD-10-CM | POA: Diagnosis not present

## 2016-06-12 DIAGNOSIS — R262 Difficulty in walking, not elsewhere classified: Secondary | ICD-10-CM | POA: Diagnosis not present

## 2016-06-19 DIAGNOSIS — R262 Difficulty in walking, not elsewhere classified: Secondary | ICD-10-CM | POA: Diagnosis not present

## 2016-06-19 DIAGNOSIS — S83511D Sprain of anterior cruciate ligament of right knee, subsequent encounter: Secondary | ICD-10-CM | POA: Diagnosis not present

## 2016-06-26 DIAGNOSIS — S83511D Sprain of anterior cruciate ligament of right knee, subsequent encounter: Secondary | ICD-10-CM | POA: Diagnosis not present

## 2016-06-26 DIAGNOSIS — R262 Difficulty in walking, not elsewhere classified: Secondary | ICD-10-CM | POA: Diagnosis not present

## 2016-07-04 DIAGNOSIS — R262 Difficulty in walking, not elsewhere classified: Secondary | ICD-10-CM | POA: Diagnosis not present

## 2016-07-04 DIAGNOSIS — S83511D Sprain of anterior cruciate ligament of right knee, subsequent encounter: Secondary | ICD-10-CM | POA: Diagnosis not present

## 2016-07-06 DIAGNOSIS — S83511D Sprain of anterior cruciate ligament of right knee, subsequent encounter: Secondary | ICD-10-CM | POA: Diagnosis not present

## 2016-07-22 IMAGING — CR DG CHEST 2V
2 series · 2 of 2 positions shown · non-contrast
Comparison: None.

CLINICAL DATA: Three weeks of cough and congestion ; history of
smoking

EXAM:
CHEST  2 VIEW

[w chest pa]
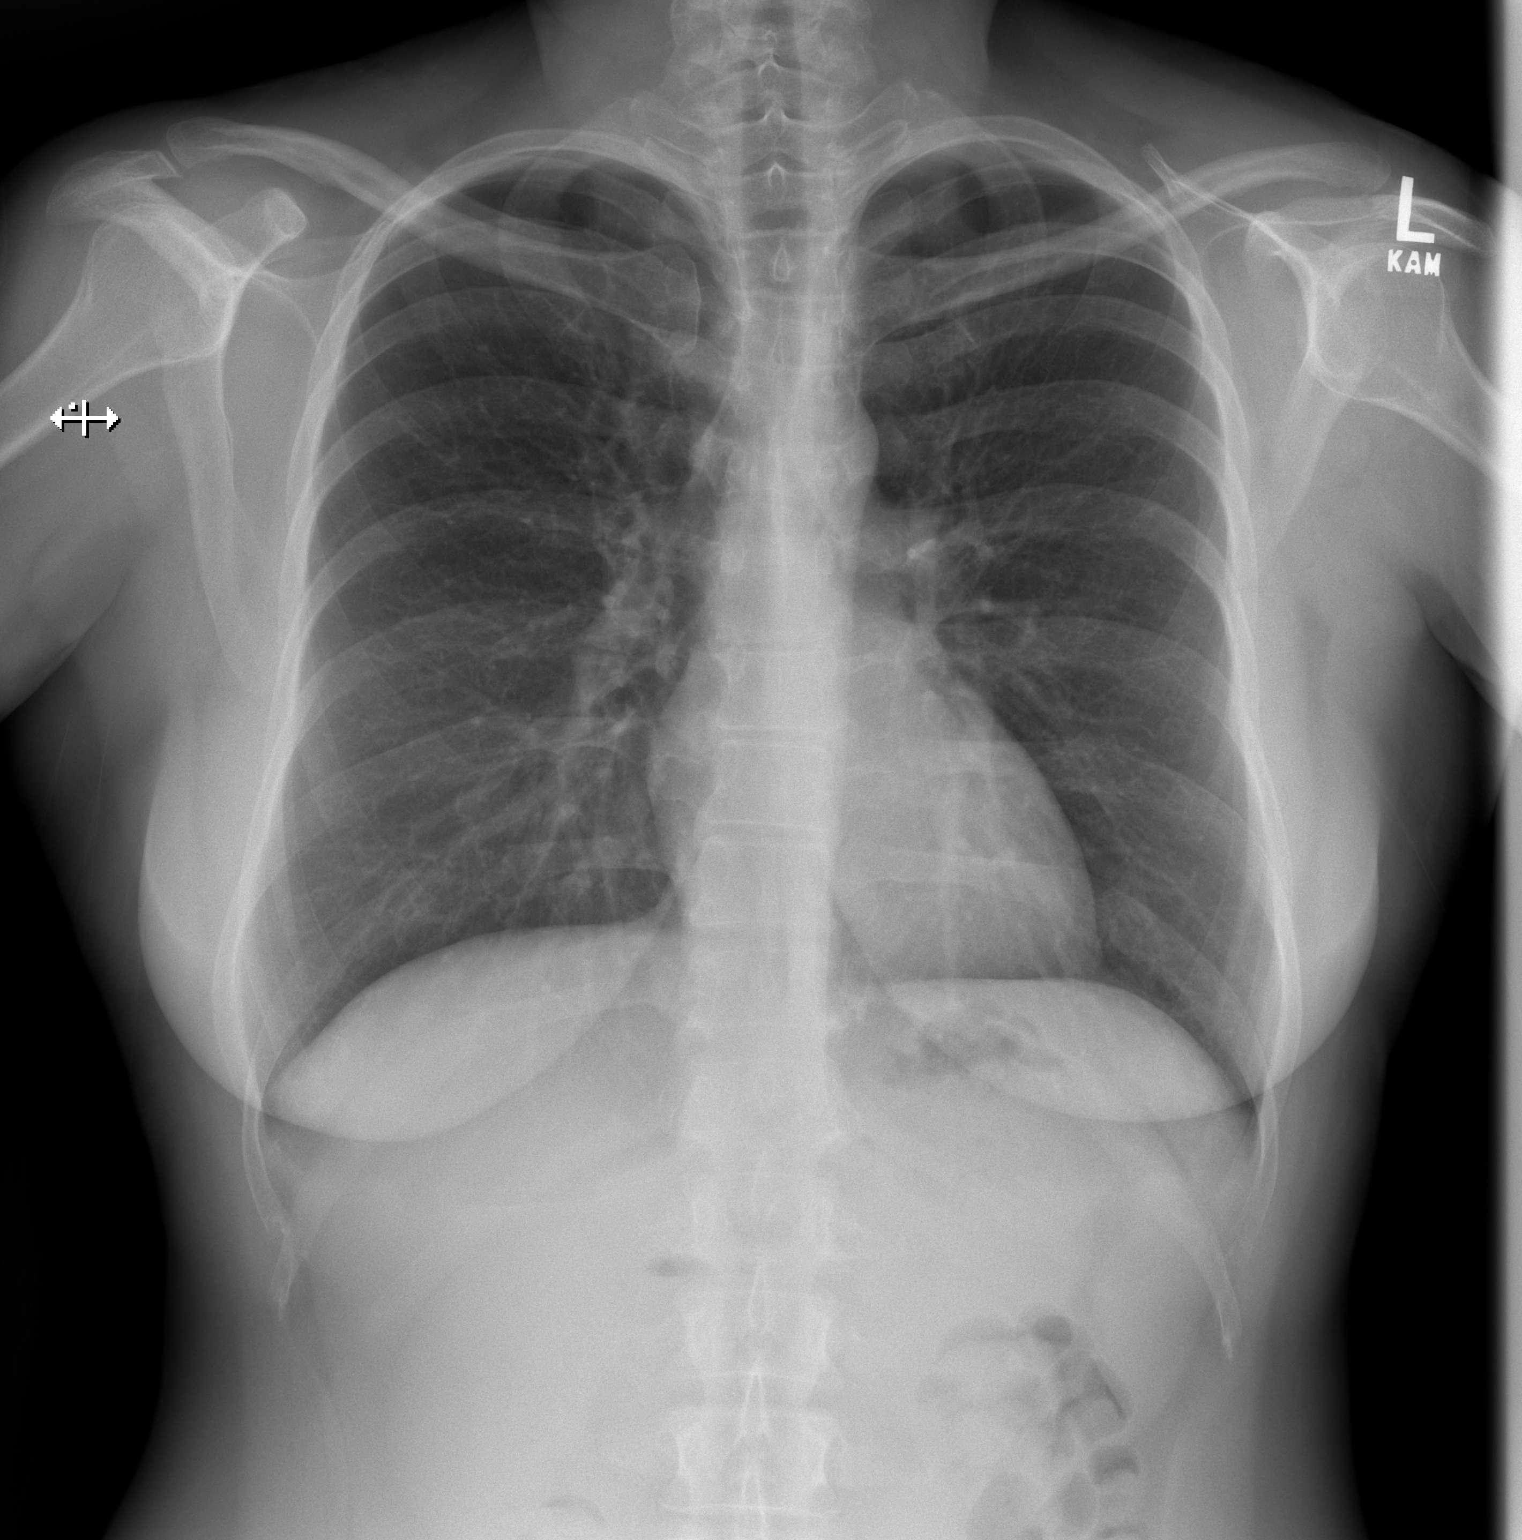

[w chest lat]
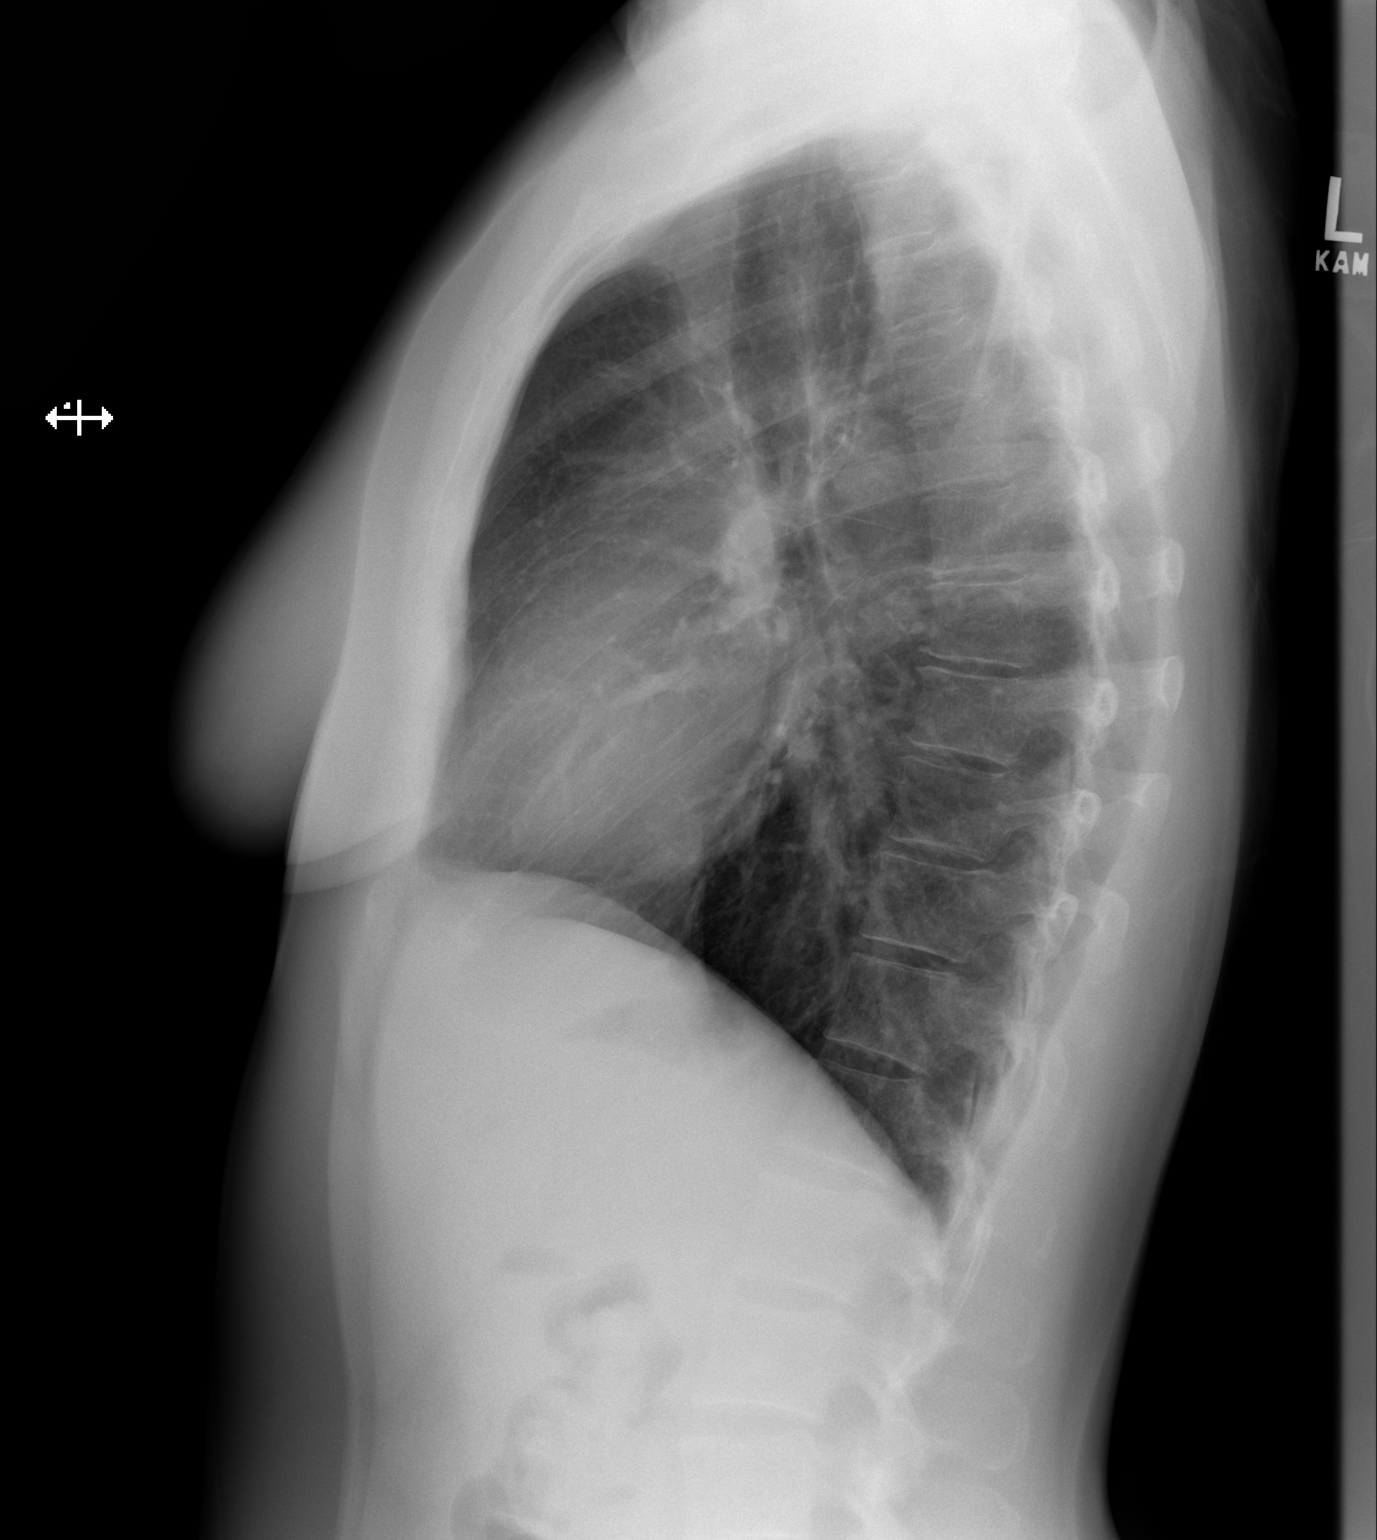

[2 of 2 positions shown; findings below may reference images not displayed]

FINDINGS: The lungs are mildly hyperinflated. There is no focal infiltrate.
There is no pleural effusion. The heart and pulmonary vascularity
are normal. The mediastinum is normal in width. The bony thorax is
unremarkable.
IMPRESSION: Hyperinflation may reflect reactive airway disease or COPD. There is
no pneumonia nor other active cardiopulmonary disease.

## 2016-12-04 DIAGNOSIS — Z01419 Encounter for gynecological examination (general) (routine) without abnormal findings: Secondary | ICD-10-CM | POA: Diagnosis not present

## 2016-12-04 DIAGNOSIS — Z6823 Body mass index (BMI) 23.0-23.9, adult: Secondary | ICD-10-CM | POA: Diagnosis not present

## 2017-01-29 DIAGNOSIS — J069 Acute upper respiratory infection, unspecified: Secondary | ICD-10-CM | POA: Diagnosis not present

## 2017-02-06 DIAGNOSIS — J329 Chronic sinusitis, unspecified: Secondary | ICD-10-CM | POA: Diagnosis not present

## 2017-02-06 DIAGNOSIS — J209 Acute bronchitis, unspecified: Secondary | ICD-10-CM | POA: Diagnosis not present

## 2017-03-02 DIAGNOSIS — T300 Burn of unspecified body region, unspecified degree: Secondary | ICD-10-CM | POA: Diagnosis not present

## 2017-03-15 DIAGNOSIS — Z1231 Encounter for screening mammogram for malignant neoplasm of breast: Secondary | ICD-10-CM | POA: Diagnosis not present

## 2017-04-05 DIAGNOSIS — D2262 Melanocytic nevi of left upper limb, including shoulder: Secondary | ICD-10-CM | POA: Diagnosis not present

## 2017-04-05 DIAGNOSIS — D2271 Melanocytic nevi of right lower limb, including hip: Secondary | ICD-10-CM | POA: Diagnosis not present

## 2017-04-05 DIAGNOSIS — L821 Other seborrheic keratosis: Secondary | ICD-10-CM | POA: Diagnosis not present

## 2017-04-05 DIAGNOSIS — D225 Melanocytic nevi of trunk: Secondary | ICD-10-CM | POA: Diagnosis not present

## 2017-06-06 DIAGNOSIS — M940 Chondrocostal junction syndrome [Tietze]: Secondary | ICD-10-CM | POA: Diagnosis not present

## 2017-06-06 DIAGNOSIS — M549 Dorsalgia, unspecified: Secondary | ICD-10-CM | POA: Diagnosis not present

## 2017-08-23 DIAGNOSIS — L570 Actinic keratosis: Secondary | ICD-10-CM | POA: Diagnosis not present

## 2017-08-23 DIAGNOSIS — L308 Other specified dermatitis: Secondary | ICD-10-CM | POA: Diagnosis not present

## 2017-09-14 DIAGNOSIS — J069 Acute upper respiratory infection, unspecified: Secondary | ICD-10-CM | POA: Diagnosis not present

## 2017-11-13 DIAGNOSIS — J069 Acute upper respiratory infection, unspecified: Secondary | ICD-10-CM | POA: Diagnosis not present

## 2018-04-18 DIAGNOSIS — Z01419 Encounter for gynecological examination (general) (routine) without abnormal findings: Secondary | ICD-10-CM | POA: Diagnosis not present

## 2018-04-18 DIAGNOSIS — Z1231 Encounter for screening mammogram for malignant neoplasm of breast: Secondary | ICD-10-CM | POA: Diagnosis not present

## 2018-04-18 DIAGNOSIS — Z6825 Body mass index (BMI) 25.0-25.9, adult: Secondary | ICD-10-CM | POA: Diagnosis not present

## 2018-06-06 DIAGNOSIS — D225 Melanocytic nevi of trunk: Secondary | ICD-10-CM | POA: Diagnosis not present

## 2018-06-06 DIAGNOSIS — D2272 Melanocytic nevi of left lower limb, including hip: Secondary | ICD-10-CM | POA: Diagnosis not present

## 2018-06-06 DIAGNOSIS — D2262 Melanocytic nevi of left upper limb, including shoulder: Secondary | ICD-10-CM | POA: Diagnosis not present

## 2018-06-06 DIAGNOSIS — D2271 Melanocytic nevi of right lower limb, including hip: Secondary | ICD-10-CM | POA: Diagnosis not present

## 2018-11-26 DIAGNOSIS — L700 Acne vulgaris: Secondary | ICD-10-CM | POA: Diagnosis not present

## 2018-11-26 DIAGNOSIS — L570 Actinic keratosis: Secondary | ICD-10-CM | POA: Diagnosis not present

## 2018-12-17 DIAGNOSIS — H0015 Chalazion left lower eyelid: Secondary | ICD-10-CM | POA: Diagnosis not present

## 2019-01-02 DIAGNOSIS — L570 Actinic keratosis: Secondary | ICD-10-CM | POA: Diagnosis not present

## 2019-01-21 DIAGNOSIS — H00025 Hordeolum internum left lower eyelid: Secondary | ICD-10-CM | POA: Diagnosis not present

## 2019-07-01 DIAGNOSIS — Z6825 Body mass index (BMI) 25.0-25.9, adult: Secondary | ICD-10-CM | POA: Diagnosis not present

## 2019-07-01 DIAGNOSIS — Z01419 Encounter for gynecological examination (general) (routine) without abnormal findings: Secondary | ICD-10-CM | POA: Diagnosis not present

## 2019-07-01 DIAGNOSIS — Z1231 Encounter for screening mammogram for malignant neoplasm of breast: Secondary | ICD-10-CM | POA: Diagnosis not present

## 2019-08-04 DIAGNOSIS — Z6824 Body mass index (BMI) 24.0-24.9, adult: Secondary | ICD-10-CM | POA: Diagnosis not present

## 2019-08-04 DIAGNOSIS — E663 Overweight: Secondary | ICD-10-CM | POA: Diagnosis not present

## 2019-11-04 DIAGNOSIS — Z6824 Body mass index (BMI) 24.0-24.9, adult: Secondary | ICD-10-CM | POA: Diagnosis not present

## 2019-11-04 DIAGNOSIS — Z713 Dietary counseling and surveillance: Secondary | ICD-10-CM | POA: Diagnosis not present

## 2020-03-04 DIAGNOSIS — Z1211 Encounter for screening for malignant neoplasm of colon: Secondary | ICD-10-CM | POA: Diagnosis not present

## 2020-03-04 DIAGNOSIS — D120 Benign neoplasm of cecum: Secondary | ICD-10-CM | POA: Diagnosis not present

## 2020-03-04 DIAGNOSIS — K635 Polyp of colon: Secondary | ICD-10-CM | POA: Diagnosis not present

## 2020-05-27 DIAGNOSIS — S83511D Sprain of anterior cruciate ligament of right knee, subsequent encounter: Secondary | ICD-10-CM | POA: Diagnosis not present

## 2020-05-27 DIAGNOSIS — M25551 Pain in right hip: Secondary | ICD-10-CM | POA: Diagnosis not present

## 2020-06-03 DIAGNOSIS — M25561 Pain in right knee: Secondary | ICD-10-CM | POA: Diagnosis not present

## 2020-06-07 DIAGNOSIS — M25561 Pain in right knee: Secondary | ICD-10-CM | POA: Diagnosis not present

## 2020-06-08 DIAGNOSIS — M25561 Pain in right knee: Secondary | ICD-10-CM | POA: Diagnosis not present

## 2020-06-23 DIAGNOSIS — S83231A Complex tear of medial meniscus, current injury, right knee, initial encounter: Secondary | ICD-10-CM | POA: Diagnosis not present

## 2020-06-23 DIAGNOSIS — G8918 Other acute postprocedural pain: Secondary | ICD-10-CM | POA: Diagnosis not present

## 2020-06-23 DIAGNOSIS — M948X6 Other specified disorders of cartilage, lower leg: Secondary | ICD-10-CM | POA: Diagnosis not present

## 2020-06-23 DIAGNOSIS — Y999 Unspecified external cause status: Secondary | ICD-10-CM | POA: Diagnosis not present

## 2020-06-23 DIAGNOSIS — X58XXXA Exposure to other specified factors, initial encounter: Secondary | ICD-10-CM | POA: Diagnosis not present

## 2020-06-23 DIAGNOSIS — S83241A Other tear of medial meniscus, current injury, right knee, initial encounter: Secondary | ICD-10-CM | POA: Diagnosis not present

## 2020-06-23 DIAGNOSIS — M1711 Unilateral primary osteoarthritis, right knee: Secondary | ICD-10-CM | POA: Diagnosis not present

## 2020-06-28 DIAGNOSIS — M25561 Pain in right knee: Secondary | ICD-10-CM | POA: Diagnosis not present

## 2020-06-28 DIAGNOSIS — M6281 Muscle weakness (generalized): Secondary | ICD-10-CM | POA: Diagnosis not present

## 2020-06-28 DIAGNOSIS — M25661 Stiffness of right knee, not elsewhere classified: Secondary | ICD-10-CM | POA: Diagnosis not present

## 2020-06-28 DIAGNOSIS — M2241 Chondromalacia patellae, right knee: Secondary | ICD-10-CM | POA: Diagnosis not present

## 2020-07-05 DIAGNOSIS — M25661 Stiffness of right knee, not elsewhere classified: Secondary | ICD-10-CM | POA: Diagnosis not present

## 2020-07-05 DIAGNOSIS — M2241 Chondromalacia patellae, right knee: Secondary | ICD-10-CM | POA: Diagnosis not present

## 2020-07-05 DIAGNOSIS — M6281 Muscle weakness (generalized): Secondary | ICD-10-CM | POA: Diagnosis not present

## 2020-07-05 DIAGNOSIS — S83241D Other tear of medial meniscus, current injury, right knee, subsequent encounter: Secondary | ICD-10-CM | POA: Diagnosis not present

## 2020-07-12 DIAGNOSIS — M25561 Pain in right knee: Secondary | ICD-10-CM | POA: Diagnosis not present

## 2020-07-12 DIAGNOSIS — S83241D Other tear of medial meniscus, current injury, right knee, subsequent encounter: Secondary | ICD-10-CM | POA: Diagnosis not present

## 2020-07-12 DIAGNOSIS — M6281 Muscle weakness (generalized): Secondary | ICD-10-CM | POA: Diagnosis not present

## 2020-07-12 DIAGNOSIS — M25661 Stiffness of right knee, not elsewhere classified: Secondary | ICD-10-CM | POA: Diagnosis not present

## 2020-07-19 DIAGNOSIS — M25661 Stiffness of right knee, not elsewhere classified: Secondary | ICD-10-CM | POA: Diagnosis not present

## 2020-07-19 DIAGNOSIS — M25561 Pain in right knee: Secondary | ICD-10-CM | POA: Diagnosis not present

## 2020-07-19 DIAGNOSIS — M6281 Muscle weakness (generalized): Secondary | ICD-10-CM | POA: Diagnosis not present

## 2020-07-19 DIAGNOSIS — M2241 Chondromalacia patellae, right knee: Secondary | ICD-10-CM | POA: Diagnosis not present

## 2020-07-26 DIAGNOSIS — M25561 Pain in right knee: Secondary | ICD-10-CM | POA: Diagnosis not present

## 2020-07-26 DIAGNOSIS — M6281 Muscle weakness (generalized): Secondary | ICD-10-CM | POA: Diagnosis not present

## 2020-07-26 DIAGNOSIS — S83241D Other tear of medial meniscus, current injury, right knee, subsequent encounter: Secondary | ICD-10-CM | POA: Diagnosis not present

## 2020-07-26 DIAGNOSIS — M25661 Stiffness of right knee, not elsewhere classified: Secondary | ICD-10-CM | POA: Diagnosis not present

## 2020-08-02 DIAGNOSIS — M2241 Chondromalacia patellae, right knee: Secondary | ICD-10-CM | POA: Diagnosis not present

## 2020-08-02 DIAGNOSIS — M25661 Stiffness of right knee, not elsewhere classified: Secondary | ICD-10-CM | POA: Diagnosis not present

## 2020-08-02 DIAGNOSIS — S83241D Other tear of medial meniscus, current injury, right knee, subsequent encounter: Secondary | ICD-10-CM | POA: Diagnosis not present

## 2020-08-02 DIAGNOSIS — M6281 Muscle weakness (generalized): Secondary | ICD-10-CM | POA: Diagnosis not present

## 2020-08-19 DIAGNOSIS — M25661 Stiffness of right knee, not elsewhere classified: Secondary | ICD-10-CM | POA: Diagnosis not present

## 2020-08-19 DIAGNOSIS — S83241D Other tear of medial meniscus, current injury, right knee, subsequent encounter: Secondary | ICD-10-CM | POA: Diagnosis not present

## 2020-08-19 DIAGNOSIS — M6281 Muscle weakness (generalized): Secondary | ICD-10-CM | POA: Diagnosis not present

## 2020-08-19 DIAGNOSIS — M25561 Pain in right knee: Secondary | ICD-10-CM | POA: Diagnosis not present

## 2020-09-02 DIAGNOSIS — M25561 Pain in right knee: Secondary | ICD-10-CM | POA: Diagnosis not present

## 2020-09-02 DIAGNOSIS — M25661 Stiffness of right knee, not elsewhere classified: Secondary | ICD-10-CM | POA: Diagnosis not present

## 2020-09-02 DIAGNOSIS — M6281 Muscle weakness (generalized): Secondary | ICD-10-CM | POA: Diagnosis not present

## 2020-09-02 DIAGNOSIS — M2241 Chondromalacia patellae, right knee: Secondary | ICD-10-CM | POA: Diagnosis not present

## 2020-09-09 DIAGNOSIS — S83241D Other tear of medial meniscus, current injury, right knee, subsequent encounter: Secondary | ICD-10-CM | POA: Diagnosis not present

## 2020-09-09 DIAGNOSIS — M2241 Chondromalacia patellae, right knee: Secondary | ICD-10-CM | POA: Diagnosis not present

## 2020-09-09 DIAGNOSIS — M6281 Muscle weakness (generalized): Secondary | ICD-10-CM | POA: Diagnosis not present

## 2020-09-09 DIAGNOSIS — M25661 Stiffness of right knee, not elsewhere classified: Secondary | ICD-10-CM | POA: Diagnosis not present

## 2020-09-20 DIAGNOSIS — Z20822 Contact with and (suspected) exposure to covid-19: Secondary | ICD-10-CM | POA: Diagnosis not present

## 2020-09-20 DIAGNOSIS — B349 Viral infection, unspecified: Secondary | ICD-10-CM | POA: Diagnosis not present

## 2020-09-20 DIAGNOSIS — J029 Acute pharyngitis, unspecified: Secondary | ICD-10-CM | POA: Diagnosis not present

## 2020-09-21 DIAGNOSIS — J029 Acute pharyngitis, unspecified: Secondary | ICD-10-CM | POA: Diagnosis not present

## 2020-09-21 DIAGNOSIS — U071 COVID-19: Secondary | ICD-10-CM | POA: Diagnosis not present

## 2020-10-05 DIAGNOSIS — Z01419 Encounter for gynecological examination (general) (routine) without abnormal findings: Secondary | ICD-10-CM | POA: Diagnosis not present

## 2020-10-05 DIAGNOSIS — Z6825 Body mass index (BMI) 25.0-25.9, adult: Secondary | ICD-10-CM | POA: Diagnosis not present

## 2020-10-05 DIAGNOSIS — Z1231 Encounter for screening mammogram for malignant neoplasm of breast: Secondary | ICD-10-CM | POA: Diagnosis not present

## 2020-11-19 DIAGNOSIS — M25512 Pain in left shoulder: Secondary | ICD-10-CM | POA: Diagnosis not present

## 2020-11-19 DIAGNOSIS — M5030 Other cervical disc degeneration, unspecified cervical region: Secondary | ICD-10-CM | POA: Diagnosis not present

## 2020-11-29 DIAGNOSIS — D0372 Melanoma in situ of left lower limb, including hip: Secondary | ICD-10-CM | POA: Diagnosis not present

## 2020-11-29 DIAGNOSIS — L821 Other seborrheic keratosis: Secondary | ICD-10-CM | POA: Diagnosis not present

## 2020-11-29 DIAGNOSIS — D2272 Melanocytic nevi of left lower limb, including hip: Secondary | ICD-10-CM | POA: Diagnosis not present

## 2020-11-29 DIAGNOSIS — D225 Melanocytic nevi of trunk: Secondary | ICD-10-CM | POA: Diagnosis not present

## 2020-11-29 DIAGNOSIS — D0371 Melanoma in situ of right lower limb, including hip: Secondary | ICD-10-CM | POA: Diagnosis not present

## 2020-11-29 DIAGNOSIS — L814 Other melanin hyperpigmentation: Secondary | ICD-10-CM | POA: Diagnosis not present

## 2020-11-30 DIAGNOSIS — M1711 Unilateral primary osteoarthritis, right knee: Secondary | ICD-10-CM | POA: Diagnosis not present

## 2020-12-07 DIAGNOSIS — M1711 Unilateral primary osteoarthritis, right knee: Secondary | ICD-10-CM | POA: Diagnosis not present

## 2020-12-14 DIAGNOSIS — M1711 Unilateral primary osteoarthritis, right knee: Secondary | ICD-10-CM | POA: Diagnosis not present

## 2021-01-13 DIAGNOSIS — D0371 Melanoma in situ of right lower limb, including hip: Secondary | ICD-10-CM | POA: Diagnosis not present

## 2021-01-13 DIAGNOSIS — L988 Other specified disorders of the skin and subcutaneous tissue: Secondary | ICD-10-CM | POA: Diagnosis not present

## 2021-03-24 DIAGNOSIS — D2271 Melanocytic nevi of right lower limb, including hip: Secondary | ICD-10-CM | POA: Diagnosis not present

## 2021-03-24 DIAGNOSIS — L821 Other seborrheic keratosis: Secondary | ICD-10-CM | POA: Diagnosis not present

## 2021-03-24 DIAGNOSIS — L814 Other melanin hyperpigmentation: Secondary | ICD-10-CM | POA: Diagnosis not present

## 2021-03-24 DIAGNOSIS — Z85820 Personal history of malignant melanoma of skin: Secondary | ICD-10-CM | POA: Diagnosis not present

## 2021-06-28 DIAGNOSIS — Z85820 Personal history of malignant melanoma of skin: Secondary | ICD-10-CM | POA: Diagnosis not present

## 2021-06-28 DIAGNOSIS — D2262 Melanocytic nevi of left upper limb, including shoulder: Secondary | ICD-10-CM | POA: Diagnosis not present

## 2021-06-28 DIAGNOSIS — D485 Neoplasm of uncertain behavior of skin: Secondary | ICD-10-CM | POA: Diagnosis not present

## 2021-06-28 DIAGNOSIS — L089 Local infection of the skin and subcutaneous tissue, unspecified: Secondary | ICD-10-CM | POA: Diagnosis not present

## 2021-06-28 DIAGNOSIS — L821 Other seborrheic keratosis: Secondary | ICD-10-CM | POA: Diagnosis not present

## 2021-06-28 DIAGNOSIS — L814 Other melanin hyperpigmentation: Secondary | ICD-10-CM | POA: Diagnosis not present

## 2021-07-25 DIAGNOSIS — J069 Acute upper respiratory infection, unspecified: Secondary | ICD-10-CM | POA: Diagnosis not present

## 2021-08-01 DIAGNOSIS — D485 Neoplasm of uncertain behavior of skin: Secondary | ICD-10-CM | POA: Diagnosis not present

## 2021-08-01 DIAGNOSIS — L988 Other specified disorders of the skin and subcutaneous tissue: Secondary | ICD-10-CM | POA: Diagnosis not present

## 2021-09-16 ENCOUNTER — Encounter (HOSPITAL_COMMUNITY): Payer: Self-pay | Admitting: *Deleted

## 2021-09-16 ENCOUNTER — Ambulatory Visit (HOSPITAL_COMMUNITY)
Admission: EM | Admit: 2021-09-16 | Discharge: 2021-09-16 | Disposition: A | Discharge status: Home / Self Care | Payer: BC Managed Care – PPO | Attending: Physician Assistant | Admitting: Physician Assistant

## 2021-09-16 ENCOUNTER — Other Ambulatory Visit: Payer: Self-pay

## 2021-09-16 DIAGNOSIS — R1084 Generalized abdominal pain: Secondary | ICD-10-CM | POA: Diagnosis not present

## 2021-09-16 LAB — POCT URINALYSIS DIPSTICK, ED / UC
Bilirubin Urine: NEGATIVE
Glucose, UA: NEGATIVE mg/dL
Ketones, ur: NEGATIVE mg/dL
Leukocytes,Ua: NEGATIVE
Nitrite: NEGATIVE
Protein, ur: NEGATIVE mg/dL
Specific Gravity, Urine: 1.025 (ref 1.005–1.030)
Urobilinogen, UA: 0.2 mg/dL (ref 0.0–1.0)
pH: 5.5 (ref 5.0–8.0)

## 2021-09-16 MED ORDER — ONDANSETRON 4 MG PO TBDP
ORAL_TABLET | ORAL | Status: AC
  Filled 2021-09-16: qty 1

## 2021-09-16 MED ORDER — ONDANSETRON 4 MG PO TBDP
4.0000 mg | ORAL_TABLET | Freq: Once | ORAL | Status: AC
  Administered 2021-09-16: 11:00:00 4 mg via ORAL

## 2021-09-16 NOTE — ED Triage Notes (Signed)
PT reports ABD pain and nausea that started this AM.

## 2021-09-16 NOTE — ED Provider Notes (Signed)
[facility]    CSN: [id number] Arrival date & time: 09/16/21  2595      History   Chief Complaint Chief Complaint  Patient presents with   Abdominal Pain   Nausea    HPI Annette Rivera is a 50 y.o. female.   Pt reports she began having pain in her lower abdomen.  Pt reports she had severe discomfort this am.  Pain is improving.  Pt complains of nausea.  Pt points to suprapubic area as area of pain   The history is provided by the patient. No language interpreter was used.  Abdominal Pain Pain location:  Generalized Pain quality: aching   Pain radiates to:  Does not radiate Pain severity:  Moderate Progression:  Worsening Context: not alcohol use, not diet changes, not laxative use, not recent illness, not sick contacts, not suspicious food intake and not trauma   Relieved by:  Nothing Worsened by:  Nothing Ineffective treatments:  None tried Associated symptoms: no chills, no fever, no nausea and no vomiting    Past Medical History:  Diagnosis Date   Acne    Endometriosis    Psoriasis     Patient Active Problem List   Diagnosis Date Noted   Cough 02/08/2015    History reviewed. No pertinent surgical history.  OB History   No obstetric history on file.      Home Medications    Prior to Admission medications   Not on File    Family History Family History  Problem Relation Age of Onset   Lung cancer Father        smoked   Hypertension Father     Social History Social History   Tobacco Use   Smoking status: Former    Packs/day: 1.00    Years: 5.00    Pack years: 5.00    Types: Cigarettes    Quit date: 09/19/1991    Years since quitting: 30.0   Smokeless tobacco: Never  Substance Use Topics   Alcohol use: No    Alcohol/week: 0.0 standard drinks   Drug use: No     Allergies   Patient has no known allergies.   Review of Systems Review of Systems  Constitutional:  Negative for chills and fever.  Gastrointestinal:   Positive for abdominal pain. Negative for nausea and vomiting.  All other systems reviewed and are negative.   Physical Exam Triage Vital Signs ED Triage Vitals  Enc Vitals Group     BP 09/16/21 1004 129/80     Pulse Rate 09/16/21 1004 67     Resp 09/16/21 1004 18     Temp 09/16/21 1004 98.3 F (36.8 C)     Temp src --      SpO2 09/16/21 1004 99 %     Weight --      Height --      Head Circumference --      Peak Flow --      Pain Score 09/16/21 1001 8     Pain Loc --      Pain Edu? --      Excl. in GC? --    No data found.  Updated Vital Signs BP 129/80    Pulse 67    Temp 98.3 F (36.8 C)    Resp 18    SpO2 99%   Visual Acuity Right Eye Distance:   Left Eye Distance:   Bilateral Distance:    Right Eye Near:   Left  Eye Near:    Bilateral Near:     Physical Exam Vitals reviewed.  Constitutional:      Appearance: She is well-developed.  HENT:     Head: Normocephalic.  Cardiovascular:     Rate and Rhythm: Normal rate.  Pulmonary:     Effort: Pulmonary effort is normal.  Abdominal:     General: Abdomen is flat. Bowel sounds are normal.     Palpations: Abdomen is soft.     Tenderness: There is abdominal tenderness in the right lower quadrant, suprapubic area and left lower quadrant.  Skin:    General: Skin is warm.  Neurological:     Mental Status: She is alert.     UC Treatments / Results  Labs (all labs ordered are listed, but only abnormal results are displayed) Labs Reviewed  POCT URINALYSIS DIPSTICK, ED / UC - Abnormal; Notable for the following components:      Result Value   Hgb urine dipstick TRACE (*)    All other components within normal limits  URINALYSIS, DIPSTICK ONLY    EKG   Radiology No results found.  Procedures Procedures (including critical care time)  Medications Ordered in UC Medications  ondansetron (ZOFRAN-ODT) disintegrating tablet 4 mg (4 mg Oral Given 09/16/21 1042)    Initial Impression / Assessment and Plan /  UC Course  I have reviewed the triage vital signs and the nursing notes.  Pertinent labs & imaging results that were available during my care of the patient were reviewed by me and considered in my medical decision making (see chart for details).     MDM:  Pt reports some relief of nausea with zofran.  Pt reports she has decreased pain but still has discomfort.  Pt counseled on causes.  Pt advised she may need further evaltuion.  Pt advised of option of going to ED for labs and imaging.  Pt has some reluctance to go to ED.  Pt advised on possible 12 hours of monitoring symptoms and following up with primary if symptoms resolved, with need to got to ED if symptoms persist.  Final Clinical Impressions(s) / UC Diagnoses   Final diagnoses:  Generalized abdominal pain     Discharge Instructions      Go to the Emergency Department if symptoms worsen or change.      ED Prescriptions   None    PDMP not reviewed this encounter.   Elson Areas, [place] 09/16/21 1129

## 2021-09-16 NOTE — Discharge Instructions (Addendum)
Go to the Emergency Department if symptoms worsen or change.

## 2021-11-18 DIAGNOSIS — Z01419 Encounter for gynecological examination (general) (routine) without abnormal findings: Secondary | ICD-10-CM | POA: Diagnosis not present

## 2021-11-18 DIAGNOSIS — Z6824 Body mass index (BMI) 24.0-24.9, adult: Secondary | ICD-10-CM | POA: Diagnosis not present

## 2021-11-18 DIAGNOSIS — Z1231 Encounter for screening mammogram for malignant neoplasm of breast: Secondary | ICD-10-CM | POA: Diagnosis not present

## 2021-12-02 DIAGNOSIS — L820 Inflamed seborrheic keratosis: Secondary | ICD-10-CM | POA: Diagnosis not present

## 2021-12-02 DIAGNOSIS — D2271 Melanocytic nevi of right lower limb, including hip: Secondary | ICD-10-CM | POA: Diagnosis not present

## 2021-12-02 DIAGNOSIS — D225 Melanocytic nevi of trunk: Secondary | ICD-10-CM | POA: Diagnosis not present

## 2021-12-02 DIAGNOSIS — D1801 Hemangioma of skin and subcutaneous tissue: Secondary | ICD-10-CM | POA: Diagnosis not present

## 2021-12-02 DIAGNOSIS — L814 Other melanin hyperpigmentation: Secondary | ICD-10-CM | POA: Diagnosis not present

## 2021-12-02 DIAGNOSIS — D0371 Melanoma in situ of right lower limb, including hip: Secondary | ICD-10-CM | POA: Diagnosis not present

## 2021-12-14 DIAGNOSIS — D0371 Melanoma in situ of right lower limb, including hip: Secondary | ICD-10-CM | POA: Diagnosis not present

## 2022-01-10 DIAGNOSIS — L239 Allergic contact dermatitis, unspecified cause: Secondary | ICD-10-CM | POA: Diagnosis not present

## 2022-01-30 ENCOUNTER — Encounter: Payer: Self-pay | Admitting: Podiatrist

## 2022-01-30 ENCOUNTER — Ambulatory Visit (INDEPENDENT_AMBULATORY_CARE_PROVIDER_SITE_OTHER): Payer: BC Managed Care – PPO | Admitting: Podiatrist

## 2022-01-30 DIAGNOSIS — L600 Ingrowing nail: Secondary | ICD-10-CM | POA: Diagnosis not present

## 2022-01-30 NOTE — Patient Instructions (Signed)
Soak Instructions    THE DAY AFTER THE PROCEDURE  Place 1/4 cup of epsom salts in a quart of warm tap water.  Submerge your foot or feet with outer bandage intact for the initial soak; this will allow the bandage to become moist and wet for easy lift off.  Once you remove your bandage, continue to soak in the solution for 20 minutes.  This soak should be done twice a day.  Next, remove your foot or feet from solution, blot dry the affected area and cover.  Apply polysporin or neosporin.   You may use a band aid large enough to cover the area or use gauze and tape.     IF YOUR SKIN BECOMES IRRITATED WHILE USING THESE INSTRUCTIONS, IT IS OKAY TO SWITCH TO  antibacterial soap pump soap (Dial)  and water to keep the toe clean instead of soaking in epsom salts.    Long Term Care Instructions-Post Nail Surgery  You have had your ingrown toenail and root treated with a chemical.  This chemical causes a burn that will drain and ooze like a blister.  1-2 weeks after the procedure you may leave the area open to air at night to help dry it up.  During the day,  It is important to keep this area clean and covered until the toe dries out and forms a scab. Once the scab forms you no longer need to soak or apply a dressing.  If at any time you experience an increase in pain, redness, swelling, or drainage, you should contact the office as soon as possible.  

## 2022-01-30 NOTE — Progress Notes (Signed)
?  Chief Complaint  ?Patient presents with  ? Ingrown Toenail  ?  ? ?HPI: Patient is 51 y.o. female who presents today for a painful ingrown toenail, medial aspect of the left great toenail. It has been bothering her for several weeks and she relates pain with pressure in shoes and when the sheets his the toe.  She denies any drainage or pus from the toe.  ? ?Patient Active Problem List  ? Diagnosis Date Noted  ? Cough 02/08/2015  ? ? ?Current Outpatient Medications on File Prior to Visit  ?Medication Sig Dispense Refill  ? buPROPion (WELLBUTRIN XL) 150 MG 24 hr tablet bupropion HCl XL 150 mg 24 hr tablet, extended release    ? buPROPion (WELLBUTRIN XL) 300 MG 24 hr tablet bupropion HCl XL 300 mg 24 hr tablet, extended release    ? buPROPion (WELLBUTRIN XL) 300 MG 24 hr tablet Take 300 mg by mouth every morning.    ? escitalopram (LEXAPRO) 10 MG tablet Take 10 mg by mouth daily.    ? imiquimod (ALDARA) 5 % cream SMARTSIG:1 Packet(s) Topical Daily on Weekdays    ? influenza vaccine (FLUCELVAX QUADRIVALENT) 0.5 ML injection Flucelvax Quad 2020-2021 (PF) 60 mcg (15 mcg x 4)/0.5 mL IM syringe ? PHARMACY ADMINISTERED    ? mupirocin ointment (BACTROBAN) 2 % SMARTSIG:1 sparingly Topical Daily    ? phentermine (ADIPEX-P) 37.5 MG tablet phentermine 37.5 mg tablet    ? triamcinolone cream (KENALOG) 0.1 % Apply topically 2 (two) times daily.    ? Zoster Vaccine Adjuvanted [facility]) injection Shingrix (PF) 50 mcg/0.5 mL intramuscular suspension, kit    ? ?No current facility-administered medications on file prior to visit.  ? ? ?No Known Allergies ? ?Review of Systems ?No fevers, chills, nausea, muscle aches, no difficulty breathing, no calf pain, no chest pain or shortness of breath. ? ? ?Physical Exam ? ?GENERAL APPEARANCE: Alert, conversant. Appropriately groomed. No acute distress.  ? ?VASCULAR: Pedal pulses palpable DP and PT bilateral.  Capillary refill time is immediate to all digits,  Proximal to distal cooling it warm  to warm.  Digital perfusion adequate.  ? ?NEUROLOGIC: sensation is intact to 5.07 monofilament at 5/5 sites bilateral.  Light touch is intact bilateral, vibratory sensation intact bilateral ? ?MUSCULOSKELETAL: acceptable muscle strength, tone and stability bilateral.  No gross boney pedal deformities noted.  No pain, crepitus or limitation noted with foot and ankle range of motion bilateral.  ? ?DERMATOLOGIC: skin is warm, supple, and dry.  Color, texture, and turgor of skin within normal limits.  No open wounds are noted.  No preulcerative lesions are seen.  Left hallux nail is ingrown on the medial nail border. No pus or pirulence noted.  ? ? ? ?Assessment  ? ?  ICD-10-CM   ?1. Ingrown toenail of left foot  L60.0   ?  ? ? ? ?Plan ? ?Treatment options and alternatives were discussed. Recommended a permanent removal of the medial nail border of the left hallux nail.  Patient agreed. Skin was prepped with alcohol and a local injection of lidocaine and Marcaine plain was infiltrated to anesthetize the toe. The toe was then prepped with Betadine exsanguinated. The offending nail border is removed and phenol applied.  It was cleansed well with alcohol. Antibiotic ointment and a dressing was then applied and the patient was given instructions for aftercare.  ? ?

## 2022-03-06 DIAGNOSIS — D2262 Melanocytic nevi of left upper limb, including shoulder: Secondary | ICD-10-CM | POA: Diagnosis not present

## 2022-03-06 DIAGNOSIS — D2239 Melanocytic nevi of other parts of face: Secondary | ICD-10-CM | POA: Diagnosis not present

## 2022-03-06 DIAGNOSIS — L814 Other melanin hyperpigmentation: Secondary | ICD-10-CM | POA: Diagnosis not present

## 2022-03-06 DIAGNOSIS — Z85820 Personal history of malignant melanoma of skin: Secondary | ICD-10-CM | POA: Diagnosis not present

## 2022-05-18 DIAGNOSIS — Z3A26 26 weeks gestation of pregnancy: Secondary | ICD-10-CM | POA: Diagnosis not present

## 2022-05-18 DIAGNOSIS — E663 Overweight: Secondary | ICD-10-CM | POA: Diagnosis not present

## 2022-07-27 DIAGNOSIS — Z6825 Body mass index (BMI) 25.0-25.9, adult: Secondary | ICD-10-CM | POA: Diagnosis not present

## 2022-07-27 DIAGNOSIS — E669 Obesity, unspecified: Secondary | ICD-10-CM | POA: Diagnosis not present

## 2022-09-04 DIAGNOSIS — L821 Other seborrheic keratosis: Secondary | ICD-10-CM | POA: Diagnosis not present

## 2022-09-04 DIAGNOSIS — Z85820 Personal history of malignant melanoma of skin: Secondary | ICD-10-CM | POA: Diagnosis not present

## 2022-09-04 DIAGNOSIS — D2271 Melanocytic nevi of right lower limb, including hip: Secondary | ICD-10-CM | POA: Diagnosis not present

## 2022-09-04 DIAGNOSIS — D225 Melanocytic nevi of trunk: Secondary | ICD-10-CM | POA: Diagnosis not present

## 2022-09-04 DIAGNOSIS — L820 Inflamed seborrheic keratosis: Secondary | ICD-10-CM | POA: Diagnosis not present

## 2022-11-13 DIAGNOSIS — E669 Obesity, unspecified: Secondary | ICD-10-CM | POA: Diagnosis not present

## 2022-11-13 DIAGNOSIS — Z6825 Body mass index (BMI) 25.0-25.9, adult: Secondary | ICD-10-CM | POA: Diagnosis not present

## 2023-01-16 DIAGNOSIS — Z1231 Encounter for screening mammogram for malignant neoplasm of breast: Secondary | ICD-10-CM | POA: Diagnosis not present

## 2023-01-16 DIAGNOSIS — Z6825 Body mass index (BMI) 25.0-25.9, adult: Secondary | ICD-10-CM | POA: Diagnosis not present

## 2023-01-16 DIAGNOSIS — Z01419 Encounter for gynecological examination (general) (routine) without abnormal findings: Secondary | ICD-10-CM | POA: Diagnosis not present

## 2023-02-28 DIAGNOSIS — Z6825 Body mass index (BMI) 25.0-25.9, adult: Secondary | ICD-10-CM | POA: Diagnosis not present

## 2023-02-28 DIAGNOSIS — Z713 Dietary counseling and surveillance: Secondary | ICD-10-CM | POA: Diagnosis not present

## 2023-03-05 DIAGNOSIS — D485 Neoplasm of uncertain behavior of skin: Secondary | ICD-10-CM | POA: Diagnosis not present

## 2023-03-05 DIAGNOSIS — L570 Actinic keratosis: Secondary | ICD-10-CM | POA: Diagnosis not present

## 2023-03-05 DIAGNOSIS — L821 Other seborrheic keratosis: Secondary | ICD-10-CM | POA: Diagnosis not present

## 2023-03-05 DIAGNOSIS — Z85820 Personal history of malignant melanoma of skin: Secondary | ICD-10-CM | POA: Diagnosis not present

## 2023-03-05 DIAGNOSIS — D2272 Melanocytic nevi of left lower limb, including hip: Secondary | ICD-10-CM | POA: Diagnosis not present

## 2023-03-05 DIAGNOSIS — D2271 Melanocytic nevi of right lower limb, including hip: Secondary | ICD-10-CM | POA: Diagnosis not present

## 2023-03-05 DIAGNOSIS — L820 Inflamed seborrheic keratosis: Secondary | ICD-10-CM | POA: Diagnosis not present

## 2023-04-23 DIAGNOSIS — J069 Acute upper respiratory infection, unspecified: Secondary | ICD-10-CM | POA: Diagnosis not present

## 2023-04-23 DIAGNOSIS — B9689 Other specified bacterial agents as the cause of diseases classified elsewhere: Secondary | ICD-10-CM | POA: Diagnosis not present

## 2023-04-23 DIAGNOSIS — R051 Acute cough: Secondary | ICD-10-CM | POA: Diagnosis not present

## 2023-04-23 DIAGNOSIS — J029 Acute pharyngitis, unspecified: Secondary | ICD-10-CM | POA: Diagnosis not present

## 2023-04-30 DIAGNOSIS — R052 Subacute cough: Secondary | ICD-10-CM | POA: Diagnosis not present

## 2023-08-13 DIAGNOSIS — Z6825 Body mass index (BMI) 25.0-25.9, adult: Secondary | ICD-10-CM | POA: Diagnosis not present

## 2023-08-13 DIAGNOSIS — Z713 Dietary counseling and surveillance: Secondary | ICD-10-CM | POA: Diagnosis not present

## 2023-09-03 DIAGNOSIS — D2272 Melanocytic nevi of left lower limb, including hip: Secondary | ICD-10-CM | POA: Diagnosis not present

## 2023-09-03 DIAGNOSIS — L820 Inflamed seborrheic keratosis: Secondary | ICD-10-CM | POA: Diagnosis not present

## 2023-09-03 DIAGNOSIS — L814 Other melanin hyperpigmentation: Secondary | ICD-10-CM | POA: Diagnosis not present

## 2023-09-03 DIAGNOSIS — D485 Neoplasm of uncertain behavior of skin: Secondary | ICD-10-CM | POA: Diagnosis not present

## 2023-09-03 DIAGNOSIS — L821 Other seborrheic keratosis: Secondary | ICD-10-CM | POA: Diagnosis not present

## 2023-09-03 DIAGNOSIS — D225 Melanocytic nevi of trunk: Secondary | ICD-10-CM | POA: Diagnosis not present

## 2023-09-03 DIAGNOSIS — Z85820 Personal history of malignant melanoma of skin: Secondary | ICD-10-CM | POA: Diagnosis not present

## 2023-10-11 DIAGNOSIS — J069 Acute upper respiratory infection, unspecified: Secondary | ICD-10-CM | POA: Diagnosis not present

## 2023-10-11 DIAGNOSIS — R509 Fever, unspecified: Secondary | ICD-10-CM | POA: Diagnosis not present

## 2024-01-29 DIAGNOSIS — Z1231 Encounter for screening mammogram for malignant neoplasm of breast: Secondary | ICD-10-CM | POA: Diagnosis not present

## 2024-01-29 DIAGNOSIS — Z6826 Body mass index (BMI) 26.0-26.9, adult: Secondary | ICD-10-CM | POA: Diagnosis not present

## 2024-01-29 DIAGNOSIS — Z124 Encounter for screening for malignant neoplasm of cervix: Secondary | ICD-10-CM | POA: Diagnosis not present

## 2024-01-29 DIAGNOSIS — N951 Menopausal and female climacteric states: Secondary | ICD-10-CM | POA: Diagnosis not present

## 2024-01-29 DIAGNOSIS — Z01419 Encounter for gynecological examination (general) (routine) without abnormal findings: Secondary | ICD-10-CM | POA: Diagnosis not present

## 2024-02-01 DIAGNOSIS — M25561 Pain in right knee: Secondary | ICD-10-CM | POA: Diagnosis not present

## 2024-07-17 DIAGNOSIS — L309 Dermatitis, unspecified: Secondary | ICD-10-CM | POA: Diagnosis not present

## 2024-07-17 DIAGNOSIS — D2271 Melanocytic nevi of right lower limb, including hip: Secondary | ICD-10-CM | POA: Diagnosis not present

## 2024-07-17 DIAGNOSIS — D225 Melanocytic nevi of trunk: Secondary | ICD-10-CM | POA: Diagnosis not present

## 2024-07-17 DIAGNOSIS — L820 Inflamed seborrheic keratosis: Secondary | ICD-10-CM | POA: Diagnosis not present

## 2024-07-17 DIAGNOSIS — D2272 Melanocytic nevi of left lower limb, including hip: Secondary | ICD-10-CM | POA: Diagnosis not present
